# Patient Record
Sex: Female | Born: 1985 | Race: Black or African American | Hispanic: No | Marital: Single | Smoking: Former smoker
Health system: Southern US, Community
[De-identification: ages and names within clinical notes are randomized; demographics above are authoritative.]

## PROBLEM LIST (undated history)

## (undated) ENCOUNTER — Inpatient Hospital Stay (HOSPITAL_COMMUNITY): Payer: Self-pay

## (undated) DIAGNOSIS — E282 Polycystic ovarian syndrome: Secondary | ICD-10-CM

## (undated) DIAGNOSIS — A749 Chlamydial infection, unspecified: Secondary | ICD-10-CM

## (undated) HISTORY — PX: DILATION AND CURETTAGE OF UTERUS: SHX78

---
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
## 2005-11-12 ENCOUNTER — Emergency Department (HOSPITAL_COMMUNITY): Admission: EM | Admit: 2005-11-12 | Discharge: 2005-11-12 | Payer: Self-pay | Admitting: Emergency Medicine

## 2006-10-24 ENCOUNTER — Emergency Department (HOSPITAL_COMMUNITY): Admission: EM | Admit: 2006-10-24 | Discharge: 2006-10-24 | Payer: Self-pay | Admitting: Emergency Medicine

## 2006-12-24 ENCOUNTER — Emergency Department (HOSPITAL_COMMUNITY): Admission: EM | Admit: 2006-12-24 | Discharge: 2006-12-24 | Payer: Self-pay | Admitting: Emergency Medicine

## 2007-01-31 ENCOUNTER — Emergency Department (HOSPITAL_COMMUNITY): Admission: EM | Admit: 2007-01-31 | Discharge: 2007-01-31 | Payer: Self-pay | Admitting: Family Medicine

## 2008-09-16 ENCOUNTER — Emergency Department (HOSPITAL_COMMUNITY): Admission: EM | Admit: 2008-09-16 | Discharge: 2008-09-16 | Payer: Self-pay | Admitting: Emergency Medicine

## 2008-12-01 ENCOUNTER — Inpatient Hospital Stay (HOSPITAL_COMMUNITY): Admission: AD | Admit: 2008-12-01 | Discharge: 2008-12-01 | Payer: Self-pay | Admitting: Obstetrics & Gynecology

## 2009-06-13 ENCOUNTER — Ambulatory Visit: Payer: Self-pay | Admitting: Obstetrics and Gynecology

## 2009-06-13 ENCOUNTER — Inpatient Hospital Stay (HOSPITAL_COMMUNITY): Admission: AD | Admit: 2009-06-13 | Discharge: 2009-06-13 | Payer: Self-pay | Admitting: Obstetrics & Gynecology

## 2009-11-23 ENCOUNTER — Ambulatory Visit: Payer: Self-pay | Admitting: Obstetrics and Gynecology

## 2009-11-23 ENCOUNTER — Inpatient Hospital Stay (HOSPITAL_COMMUNITY): Admission: AD | Admit: 2009-11-23 | Discharge: 2009-11-23 | Payer: Self-pay | Admitting: Obstetrics & Gynecology

## 2010-04-19 LAB — CBC
HCT: 33.8 % — ABNORMAL LOW (ref 36.0–46.0)
Hemoglobin: 11.3 g/dL — ABNORMAL LOW (ref 12.0–15.0)
MCH: 32.9 pg (ref 26.0–34.0)
MCHC: 33.5 g/dL (ref 30.0–36.0)
MCV: 98.2 fL (ref 78.0–100.0)
Platelets: 220 10*3/uL (ref 150–400)
RBC: 3.44 MIL/uL — ABNORMAL LOW (ref 3.87–5.11)
RDW: 13.1 % (ref 11.5–15.5)
WBC: 7.3 10*3/uL (ref 4.0–10.5)

## 2010-04-19 LAB — URINALYSIS, ROUTINE W REFLEX MICROSCOPIC
Bilirubin Urine: NEGATIVE
Glucose, UA: NEGATIVE mg/dL
Hgb urine dipstick: NEGATIVE
Ketones, ur: NEGATIVE mg/dL
Nitrite: NEGATIVE
Protein, ur: NEGATIVE mg/dL
Specific Gravity, Urine: 1.015 (ref 1.005–1.030)
Urobilinogen, UA: 1 mg/dL (ref 0.0–1.0)
pH: 8.5 — ABNORMAL HIGH (ref 5.0–8.0)

## 2010-04-19 LAB — POCT PREGNANCY, URINE: Preg Test, Ur: NEGATIVE

## 2010-04-19 LAB — WET PREP, GENITAL
Clue Cells Wet Prep HPF POC: NONE SEEN
Trich, Wet Prep: NONE SEEN
Yeast Wet Prep HPF POC: NONE SEEN

## 2010-04-19 LAB — GC/CHLAMYDIA PROBE AMP, GENITAL: GC Probe Amp, Genital: NEGATIVE

## 2010-04-25 LAB — WET PREP, GENITAL

## 2010-04-25 LAB — CBC
Hemoglobin: 12.3 g/dL (ref 12.0–15.0)
MCHC: 34.9 g/dL (ref 30.0–36.0)
RBC: 3.59 MIL/uL — ABNORMAL LOW (ref 3.87–5.11)
WBC: 6 10*3/uL (ref 4.0–10.5)

## 2010-04-25 LAB — GC/CHLAMYDIA PROBE AMP, GENITAL
Chlamydia, DNA Probe: NEGATIVE
GC Probe Amp, Genital: NEGATIVE

## 2010-05-11 LAB — WET PREP, GENITAL
Trich, Wet Prep: NONE SEEN
Yeast Wet Prep HPF POC: NONE SEEN

## 2010-05-11 LAB — GC/CHLAMYDIA PROBE AMP, GENITAL
Chlamydia, DNA Probe: NEGATIVE
GC Probe Amp, Genital: NEGATIVE

## 2010-05-13 LAB — POCT URINALYSIS DIP (DEVICE)
Nitrite: NEGATIVE
Protein, ur: NEGATIVE mg/dL
Urobilinogen, UA: 1 mg/dL (ref 0.0–1.0)
pH: 7 (ref 5.0–8.0)

## 2010-05-13 LAB — WET PREP, GENITAL: Yeast Wet Prep HPF POC: NONE SEEN

## 2010-05-13 LAB — POCT PREGNANCY, URINE: Preg Test, Ur: NEGATIVE

## 2010-06-23 NOTE — Op Note (Signed)
[facility]  Patient:    Mercedes Riley, Mercedes Riley                        MRN: [id number] Proc. Date: 08/21/00 Adm. Date:  [id number] Disc. Date: [id number] Attending:  Ilene Qua                           Operative Report  PREOPERATIVE DIAGNOSIS:       Incomplete abortion.  POSTOPERATIVE DIAGNOSIS:      Incomplete abortion.  PROCEDURE:                    Dilation and evacuation.  SURGEON:                      Sherry A. Rosalio Macadamia, M.D.  ANESTHESIA:                   MAC.  INDICATIONS:                  The patient is a 25 year old G1, P0 female whose last menstrual period was Jun 11, 2000.  The patient had a positive pregnancy test on July 12.  The patient noted the onset of bleeding the evening of July 12.  She continued to have bleeding for the next five days.  Her cramping became intermittent.  The patient had severe pain and cramping on July 17 in the early morning.  The patient was seen at [facility] Emergency Room and was found to have an inevitable abortion.  She had not passed any tissue at that time.  Her bleeding was minimal.  She was sent home.  The patient noted increased cramping and bleeding.  The patient was brought to [facility] by EMS.  The patient was evaluated and felt to have only mild bleeding but, because of the bleeding and cramping with ultrasound showing no fetal heart tones, the patient is prepared for D&E.  On exam in the emergency room, the patient had passed the fetal sac but no placental tissue.  FINDINGS:                     An eight-week sized anteflexed uterus.  A moderate amount of placental tissue within the uterus.  No adnexal mass.  DESCRIPTION OF PROCEDURE:     The patient was brought into the operating room and given adequate IV sedation.  She was placed in the dorsal lithotomy position.  Her perineum was washed with Betadine.  Pelvic exam was performed. The patient was draped in a sterile fashion.  A speculum  was placed within the vagina.  The vagina was washed with Betadine.  A paracervical block was administered with 1% Nesacaine.  The cervix was grasped with a single-tooth tenaculum.  Blood lot was removed from the cervix.  A #8 curved curet was introduced into the endometrial cavity.  No dilating was necessary.  Suction was applied.  POC were obtained.  Suction was continued until no further tissue was present and adequate hemostasis was present.  All instruments were then removed from the vagina.  The patient was taken out of the dorsal lithotomy position.  She was awakened.  She was moved from the operating table to a stretcher in stable condition.  COMPLICATIONS:                None.  ESTIMATED BLOOD LOSS:  Less than 5 cc. DD:  08/21/00 TD:  08/21/00 Job: [id number]

## 2010-08-09 ENCOUNTER — Emergency Department (HOSPITAL_COMMUNITY)
Admission: EM | Admit: 2010-08-09 | Discharge: 2010-08-10 | Disposition: A | Discharge status: Home / Self Care | Payer: Self-pay | Attending: Emergency Medicine | Admitting: Emergency Medicine

## 2010-08-09 DIAGNOSIS — Y92009 Unspecified place in unspecified non-institutional (private) residence as the place of occurrence of the external cause: Secondary | ICD-10-CM | POA: Insufficient documentation

## 2010-08-09 DIAGNOSIS — S71109A Unspecified open wound, unspecified thigh, initial encounter: Secondary | ICD-10-CM | POA: Insufficient documentation

## 2010-08-09 DIAGNOSIS — S71009A Unspecified open wound, unspecified hip, initial encounter: Secondary | ICD-10-CM | POA: Insufficient documentation

## 2010-08-09 DIAGNOSIS — W540XXA Bitten by dog, initial encounter: Secondary | ICD-10-CM | POA: Insufficient documentation

## 2010-11-10 LAB — GC/CHLAMYDIA PROBE AMP, GENITAL: Chlamydia, DNA Probe: NEGATIVE

## 2010-11-10 LAB — WET PREP, GENITAL
Trich, Wet Prep: NONE SEEN
Yeast Wet Prep HPF POC: NONE SEEN

## 2010-11-10 LAB — POCT PREGNANCY, URINE
Operator id: 239701
Preg Test, Ur: NEGATIVE

## 2010-11-14 LAB — POCT PREGNANCY, URINE: Preg Test, Ur: NEGATIVE

## 2010-11-14 LAB — POCT URINALYSIS DIP (DEVICE)
Bilirubin Urine: NEGATIVE
Ketones, ur: NEGATIVE
Specific Gravity, Urine: 1.025
pH: 6.5

## 2010-11-14 LAB — WET PREP, GENITAL
Trich, Wet Prep: NONE SEEN
Yeast Wet Prep HPF POC: NONE SEEN

## 2010-11-14 LAB — URINE CULTURE
Colony Count: NO GROWTH
Culture: NO GROWTH

## 2010-11-14 LAB — RPR: RPR Ser Ql: NONREACTIVE

## 2010-11-14 LAB — HIV ANTIBODY (ROUTINE TESTING W REFLEX): HIV: NONREACTIVE

## 2010-11-14 LAB — HEPATITIS B CORE ANTIBODY, TOTAL: Hep B Core Total Ab: NEGATIVE

## 2010-11-14 LAB — HEPATITIS C ANTIBODY: HCV Ab: NEGATIVE

## 2010-12-16 ENCOUNTER — Encounter: Payer: Self-pay | Admitting: *Deleted

## 2010-12-16 ENCOUNTER — Emergency Department (HOSPITAL_COMMUNITY): Payer: Self-pay

## 2010-12-16 ENCOUNTER — Emergency Department (HOSPITAL_COMMUNITY)
Admission: EM | Admit: 2010-12-16 | Discharge: 2010-12-17 | Disposition: A | Discharge status: Home / Self Care | Payer: Self-pay | Attending: Emergency Medicine | Admitting: Emergency Medicine

## 2010-12-16 DIAGNOSIS — S62309A Unspecified fracture of unspecified metacarpal bone, initial encounter for closed fracture: Secondary | ICD-10-CM | POA: Insufficient documentation

## 2010-12-16 DIAGNOSIS — M7989 Other specified soft tissue disorders: Secondary | ICD-10-CM | POA: Insufficient documentation

## 2010-12-16 DIAGNOSIS — F17200 Nicotine dependence, unspecified, uncomplicated: Secondary | ICD-10-CM | POA: Insufficient documentation

## 2010-12-16 DIAGNOSIS — W2209XA Striking against other stationary object, initial encounter: Secondary | ICD-10-CM | POA: Insufficient documentation

## 2010-12-16 NOTE — ED Notes (Signed)
Pt in c/o right hand pain after punching a wall

## 2010-12-17 MED ORDER — HYDROCODONE-ACETAMINOPHEN 5-325 MG PO TABS: 2.0000 | ORAL_TABLET | ORAL | Status: AC | PRN

## 2010-12-17 MED ORDER — OXYCODONE-ACETAMINOPHEN 5-325 MG PO TABS
ORAL_TABLET | ORAL | Status: AC
  Administered 2010-12-17: 1
  Filled 2010-12-17: qty 1

## 2010-12-17 NOTE — ED Provider Notes (Signed)
History     CSN: [id number] Arrival date & time: 12/16/2010 11:44 PM   First MD Initiated Contact with Patient 12/17/10 [phone]      Chief Complaint  Patient presents with  . Hand Injury    (Consider location/radiation/quality/duration/timing/severity/associated sxs/prior treatment) Patient is a 25 y.o. female presenting with hand injury. The history is provided by the patient.  Hand Injury  The incident occurred 3 to 5 hours ago. The incident occurred at home. Injury mechanism: She struck a wall in anger injuring her right hand. The pain is present in the right wrist and right hand. The quality of the pain is described as throbbing. The pain is moderate. The pain has been constant since the incident. Pertinent negatives include no malaise/fatigue. She reports no foreign bodies present. The symptoms are aggravated by movement, palpation and use. She has tried nothing for the symptoms.   No other injuries, no tingling in the fingers, constant pain, which is moderate.  History reviewed. No pertinent past medical history.  History reviewed. No pertinent past surgical history.  History reviewed. No pertinent family history.  History  Substance Use Topics  . Smoking status: Current Everyday Smoker  . Smokeless tobacco: Not on file  . Alcohol Use: Yes    OB History    Grav Para Term Preterm Abortions TAB SAB Ect Mult Living                  Review of Systems  Constitutional: Negative for malaise/fatigue.  All other systems reviewed and are negative.    Allergies  Review of patient's allergies indicates no known allergies.  Home Medications   Current Outpatient Rx  Name Route Sig Dispense Refill  . IBUPROFEN 200 MG PO TABS Oral Take 200 mg by mouth every 6 (six) hours as needed. For pain       BP 110/61  Pulse 84  Temp(Src) 98 F (36.7 C) (Oral)  Resp 20  SpO2 99%  Physical Exam  Nursing note and vitals reviewed. Constitutional: She is oriented to person, place,  and time. She appears well-developed and well-nourished.  HENT:  Head: Normocephalic.  Neck: Normal range of motion.  Pulmonary/Chest: Effort normal.  Musculoskeletal:       Right hand, tender, swollen and ecchymotic at the base of the fifth metacarpal, without deformity. Mild, right wrist. Tenderness dorsally, but no swelling or limitation of motion. Fingers of the right hand appear normal.  Neurological: She is alert and oriented to person, place, and time.  Skin: Skin is warm and dry.  Psychiatric: She has a normal mood and affect. Her behavior is normal. Judgment and thought content normal.    ED Course  Procedures (including critical care time) Orthopedic splint  placed by the orthopedic technician Labs Reviewed - No data to display Dg Hand Complete Right  12/17/2010  *RADIOLOGY REPORT*  Clinical Data: Hit wall; right fifth metacarpal pain.  RIGHT HAND - COMPLETE 3+ VIEW  Comparison: None.  Findings: There is a mildly displaced oblique fracture through the base of the fifth metacarpal, extending to the edge of the carpometacarpal articulation.  No additional fractures are seen.  Overlying soft tissue swelling is noted.  The carpal rows are intact, and demonstrate normal alignment.  IMPRESSION: Mildly displaced oblique fracture through the base of the fifth metacarpal, extending to the edge of the carpometacarpal articulation.  Original Report Authenticated By: Tonia Ghent, M.D.    Diagnosis: Metacarpal fracture  MDM  Nondisplaced fracture, treated with splint, referred  to orthopedics for disposition        Flint Melter, MD 12/17/10 [phone]

## 2011-08-25 ENCOUNTER — Emergency Department (HOSPITAL_COMMUNITY)
Admission: EM | Admit: 2011-08-25 | Discharge: 2011-08-25 | Disposition: A | Discharge status: Home / Self Care | Payer: Self-pay | Attending: Emergency Medicine | Admitting: Emergency Medicine

## 2011-08-25 ENCOUNTER — Emergency Department (HOSPITAL_COMMUNITY): Payer: Self-pay

## 2011-08-25 ENCOUNTER — Encounter (HOSPITAL_COMMUNITY): Payer: Self-pay | Admitting: Physical Medicine and Rehabilitation

## 2011-08-25 DIAGNOSIS — F17200 Nicotine dependence, unspecified, uncomplicated: Secondary | ICD-10-CM | POA: Insufficient documentation

## 2011-08-25 DIAGNOSIS — M722 Plantar fascial fibromatosis: Secondary | ICD-10-CM | POA: Insufficient documentation

## 2011-08-25 MED ORDER — NAPROXEN 500 MG PO TABS: 500.0000 mg | ORAL_TABLET | Freq: Two times a day (BID) | ORAL | Status: DC

## 2011-08-25 NOTE — ED Provider Notes (Addendum)
History     CSN: [id number]  Arrival date & time 08/25/11  1215   First MD Initiated Contact with Patient 08/25/11 1226      Chief Complaint  Patient presents with  . Foot Pain    (Consider location/radiation/quality/duration/timing/severity/associated sxs/prior treatment) HPI Patient presents with left foot pain for 2 days. 9/10 pain. Patient states the foot had been swollen and painful to touch on bottom of foot, and worse with weight bearing. She denies recent injury or trauma to foot. Described as sharp and aching.     No past medical history on file.  No past surgical history on file.  No family history on file.  History  Substance Use Topics  . Smoking status: Current Everyday Smoker  . Smokeless tobacco: Not on file  . Alcohol Use: Yes    OB History    Grav Para Term Preterm Abortions TAB SAB Ect Mult Living                  Review of Systems  Constitutional: Negative for fever.  HENT: Negative for neck pain.   Respiratory: Negative for shortness of breath.   Cardiovascular: Negative for chest pain.  Gastrointestinal: Negative for abdominal pain.  Genitourinary: Negative for dysuria.  Musculoskeletal: Negative for back pain.  Skin: Negative for rash.  Neurological: Negative for headaches.  Hematological: Does not bruise/bleed easily.    Allergies  Review of patient's allergies indicates no known allergies.  Home Medications   Current Outpatient Rx  Name Route Sig Dispense Refill  . NAPROXEN 500 MG PO TABS Oral Take 1 tablet (500 mg total) by mouth 2 (two) times daily. 14 tablet 0    BP 111/65  Pulse 86  Temp 98.6 F (37 C) (Oral)  Resp 18  SpO2 99%  LMP 08/23/2011  Physical Exam  Nursing note and vitals reviewed. Constitutional: She is oriented to person, place, and time. She appears well-developed and well-nourished.  HENT:  Head: Normocephalic and atraumatic.  Mouth/Throat: Oropharynx is clear and moist.  Eyes: Conjunctivae and EOM  are normal. Pupils are equal, round, and reactive to light.  Neck: Normal range of motion. Neck supple.  Cardiovascular: Normal rate, regular rhythm and normal heart sounds.   No murmur heard. Pulmonary/Chest: Effort normal and breath sounds normal.  Abdominal: Soft. Bowel sounds are normal. There is no tenderness.  Musculoskeletal: Normal range of motion. She exhibits no edema.       Left foot with mild tenderness to bottom of foot. No swelling no erythema.  Neurological: She is alert and oriented to person, place, and time. No cranial nerve deficit. She exhibits normal muscle tone. Coordination normal.  Skin: Skin is warm. No erythema.    ED Course  Procedures (including critical care time)  Labs Reviewed - No data to display Dg Foot Complete Left  08/25/2011  *RADIOLOGY REPORT*  Clinical Data: Left-sided foot pain.  LEFT FOOT - COMPLETE 3+ VIEW  Comparison: No priors.  Findings: Three views left foot demonstrate no acute fracture, subluxation, dislocation, joint or soft tissue abnormality.  IMPRESSION: 1.  No acute radiographic abnormality of the left foot to account for the patient's symptoms.  Original Report Authenticated By: Florencia Reasons, M.D.     1. Plantar fasciitis       MDM   Symptoms consistent with a plantar fasciitis we'll treat with anti-inflammatories and podiatry referral recommended.            Shelda Jakes, MD 08/25/11 1352  Shelda Jakes, MD 10/10/11 [phone]

## 2011-08-25 NOTE — ED Notes (Signed)
Pt presents to department for evaluation of L foot pain. Ongoing x2 days. Pt states foot has been swollen and painful to touch at home. 9/10 pain, becomes worse with weight bearing. Denies recent injuries. No obvious deformities noted. She is alert and oriented x4. No signs of acute distress noted.

## 2012-02-04 ENCOUNTER — Encounter (HOSPITAL_COMMUNITY): Payer: Self-pay | Admitting: Cardiology

## 2012-02-04 ENCOUNTER — Emergency Department (HOSPITAL_COMMUNITY)
Admission: EM | Admit: 2012-02-04 | Discharge: 2012-02-04 | Disposition: A | Discharge status: Home / Self Care | Payer: Self-pay | Attending: Emergency Medicine | Admitting: Emergency Medicine

## 2012-02-04 DIAGNOSIS — N760 Acute vaginitis: Secondary | ICD-10-CM | POA: Insufficient documentation

## 2012-02-04 DIAGNOSIS — F17200 Nicotine dependence, unspecified, uncomplicated: Secondary | ICD-10-CM | POA: Insufficient documentation

## 2012-02-04 DIAGNOSIS — R109 Unspecified abdominal pain: Secondary | ICD-10-CM

## 2012-02-04 DIAGNOSIS — B9689 Other specified bacterial agents as the cause of diseases classified elsewhere: Secondary | ICD-10-CM

## 2012-02-04 DIAGNOSIS — Z79899 Other long term (current) drug therapy: Secondary | ICD-10-CM | POA: Insufficient documentation

## 2012-02-04 DIAGNOSIS — R1031 Right lower quadrant pain: Secondary | ICD-10-CM | POA: Insufficient documentation

## 2012-02-04 LAB — WET PREP, GENITAL
Trich, Wet Prep: NONE SEEN
Yeast Wet Prep HPF POC: NONE SEEN

## 2012-02-04 LAB — BASIC METABOLIC PANEL WITH GFR
BUN: 13 mg/dL (ref 6–23)
CO2: 25 meq/L (ref 19–32)
Calcium: 8.9 mg/dL (ref 8.4–10.5)
Chloride: 102 meq/L (ref 96–112)
Creatinine, Ser: 0.66 mg/dL (ref 0.50–1.10)
GFR calc Af Amer: >90 mL/min
GFR calc non Af Amer: >90 mL/min
Glucose, Bld: 101 mg/dL — ABNORMAL HIGH (ref 70–99)
Potassium: 3.5 meq/L (ref 3.5–5.1)
Sodium: 138 meq/L (ref 135–145)

## 2012-02-04 LAB — CBC WITH DIFFERENTIAL/PLATELET
Lymphocytes Relative: 26 % (ref 12–46)
Lymphs Abs: 2 10*3/uL (ref 0.7–4.0)
Neutrophils Relative %: 63 % (ref 43–77)
Platelets: 232 10*3/uL (ref 150–400)
RBC: 3.73 MIL/uL — ABNORMAL LOW (ref 3.87–5.11)
WBC: 8 10*3/uL (ref 4.0–10.5)

## 2012-02-04 LAB — URINALYSIS, ROUTINE W REFLEX MICROSCOPIC
Glucose, UA: NEGATIVE mg/dL
Hgb urine dipstick: NEGATIVE
Leukocytes, UA: NEGATIVE
Specific Gravity, Urine: 1.026 (ref 1.005–1.030)
Urobilinogen, UA: 0.2 mg/dL (ref 0.0–1.0)

## 2012-02-04 LAB — PREGNANCY, URINE: Preg Test, Ur: NEGATIVE

## 2012-02-04 MED ORDER — METRONIDAZOLE 500 MG PO TABS
500.0000 mg | ORAL_TABLET | Freq: Once | ORAL | Status: AC
  Administered 2012-02-04: 500 mg via ORAL
  Filled 2012-02-04: qty 1

## 2012-02-04 MED ORDER — ONDANSETRON HCL 4 MG/2ML IJ SOLN
4.0000 mg | Freq: Once | INTRAMUSCULAR | Status: AC
  Administered 2012-02-04: 4 mg via INTRAVENOUS
  Filled 2012-02-04: qty 2

## 2012-02-04 MED ORDER — NAPROXEN 375 MG PO TABS: 375.0000 mg | ORAL_TABLET | Freq: Two times a day (BID) | ORAL | Status: DC

## 2012-02-04 MED ORDER — METRONIDAZOLE 500 MG PO TABS: 500.0000 mg | ORAL_TABLET | Freq: Two times a day (BID) | ORAL | Status: DC

## 2012-02-04 MED ORDER — MORPHINE SULFATE 4 MG/ML IJ SOLN
4.0000 mg | Freq: Once | INTRAMUSCULAR | Status: AC
  Administered 2012-02-04: 4 mg via INTRAVENOUS
  Filled 2012-02-04: qty 1

## 2012-02-04 MED ORDER — SODIUM CHLORIDE 0.9 % IV BOLUS (SEPSIS)
1000.0000 mL | Freq: Once | INTRAVENOUS | Status: AC
  Administered 2012-02-04: 1000 mL via INTRAVENOUS

## 2012-02-04 NOTE — ED Provider Notes (Signed)
Medical screening examination/treatment/procedure(s) were performed by non-physician practitioner and as supervising physician I was immediately available for consultation/collaboration.   Glynn Octave, MD 02/04/12 [phone]

## 2012-02-04 NOTE — ED Notes (Signed)
Pt reports right-sided abd pain that started last night. Reports pain with movement. Denies any n/v.

## 2012-02-04 NOTE — ED Provider Notes (Signed)
History     CSN: [id number]  Arrival date & time 02/04/12  4098   First MD Initiated Contact with Patient 02/04/12 [phone]      No chief complaint on file.   (Consider location/radiation/quality/duration/timing/severity/associated sxs/prior treatment) HPI  26 year old female presents c/o R side pain.  Patient reports yesterday while she was sitting on the couch she developed acute onset of pain to her right side of abdomen. Describe pain as a sharp sensation that is constant, with mild nausea.  Pain is been persistent, moderate in severity, nothing makes it better, however worse with movement. She tried taking extra strength Tylenol without relief. Pain woke her up from sleep this morning which prompted her to come to the ER for further evaluation. She has never had similar pain like this before. She denies fever, chills, chest pain, shortness of breath, back pain, vomiting, diarrhea, rash, dysuria, hematuria, or vaginal discharge. Last menstrual period was 3 weeks ago. She has normal appetite. She denies any recent strenuous exercise or heavy lifting. No prior history of kidney stone. No history of appendectomy.  No past medical history on file.  No past surgical history on file.  No family history on file.  History  Substance Use Topics  . Smoking status: Current Every Day Smoker  . Smokeless tobacco: Not on file  . Alcohol Use: Yes    OB History    Grav Para Term Preterm Abortions TAB SAB Ect Mult Living                  Review of Systems  Constitutional: Negative for fever.       10 Systems reviewed and are negative for acute change except as noted in the HPI  HENT: Negative for rhinorrhea.   Respiratory: Negative for cough and shortness of breath.   Cardiovascular: Negative for chest pain.  Gastrointestinal: Positive for abdominal pain. Negative for vomiting and diarrhea.  Genitourinary: Negative for dysuria.  Musculoskeletal: Negative for back pain.  Skin: Negative  for rash.  Neurological: Negative for dizziness, syncope, speech difficulty, weakness, numbness and headaches.  Psychiatric/Behavioral: Negative for confusion.    Allergies  Review of patient's allergies indicates no known allergies.  Home Medications   Current Outpatient Rx  Name  Route  Sig  Dispense  Refill  . NAPROXEN 500 MG PO TABS   Oral   Take 1 tablet (500 mg total) by mouth 2 (two) times daily.   14 tablet   0     There were no vitals taken for this visit.  Physical Exam  Nursing note and vitals reviewed. Constitutional: She is oriented to person, place, and time. She appears well-developed and well-nourished. No distress.       Awake, alert, nontoxic appearance  HENT:  Head: Atraumatic.  Eyes: Conjunctivae normal are normal. Right eye exhibits no discharge. Left eye exhibits no discharge.  Neck: Neck supple.  Cardiovascular: Normal rate and regular rhythm.   Pulmonary/Chest: Effort normal. No respiratory distress. She exhibits no tenderness.  Abdominal: Soft. There is tenderness (tenderness to RLQ and to lateral aspect of abdomen.  No CVA tenderness, no hernia noted.  No overlying skin changes). There is no rebound and no guarding. Hernia confirmed negative in the right inguinal area and confirmed negative in the left inguinal area.  Genitourinary: Uterus normal. There is no tenderness or lesion on the right labia. There is no tenderness or lesion on the left labia. Cervix exhibits no motion tenderness, no discharge and  no friability. Right adnexum displays no tenderness. Left adnexum displays no tenderness. No tenderness or bleeding around the vagina. No foreign body around the vagina. Vaginal discharge found.       Chaperone present  Moderate functional discharge.  No tenderness.  No rash, no abnormal bleeding.   Musculoskeletal: She exhibits no tenderness.       ROM appears intact, no obvious focal weakness  Lymphadenopathy:       Right: No inguinal adenopathy  present.       Left: No inguinal adenopathy present.  Neurological: She is alert and oriented to person, place, and time.       Mental status and motor strength appears intact  Skin: No rash noted.  Psychiatric: She has a normal mood and affect.    ED Course  Procedures (including critical care time)  Results for orders placed during the hospital encounter of 02/04/12  CBC WITH DIFFERENTIAL      Component Value Range   WBC 8.0  4.0 - 10.5 K/uL   RBC 3.73 (*) 3.87 - 5.11 MIL/uL   Hemoglobin 11.6 (*) 12.0 - 15.0 g/dL   HCT 16.1 (*) 09.6 - 04.5 %   MCV 95.7  78.0 - 100.0 fL   MCH 31.1  26.0 - 34.0 pg   MCHC 32.5  30.0 - 36.0 g/dL   RDW 40.9  81.1 - 91.4 %   Platelets 232  150 - 400 K/uL   Neutrophils Relative 63  43 - 77 %   Neutro Abs 5.0  1.7 - 7.7 K/uL   Lymphocytes Relative 26  12 - 46 %   Lymphs Abs 2.0  0.7 - 4.0 K/uL   Monocytes Relative 7  3 - 12 %   Monocytes Absolute 0.5  0.1 - 1.0 K/uL   Eosinophils Relative 4  0 - 5 %   Eosinophils Absolute 0.3  0.0 - 0.7 K/uL   Basophils Relative 1  0 - 1 %   Basophils Absolute 0.0  0.0 - 0.1 K/uL  BASIC METABOLIC PANEL      Component Value Range   Sodium 138  135 - 145 mEq/L   Potassium 3.5  3.5 - 5.1 mEq/L   Chloride 102  96 - 112 mEq/L   CO2 25  19 - 32 mEq/L   Glucose, Bld 101 (*) 70 - 99 mg/dL   BUN 13  6 - 23 mg/dL   Creatinine, Ser 7.82  0.50 - 1.10 mg/dL   Calcium 8.9  8.4 - 95.6 mg/dL   GFR calc non Af Amer >90  >90 mL/min   GFR calc Af Amer >90  >90 mL/min  URINALYSIS, ROUTINE W REFLEX MICROSCOPIC      Component Value Range   Color, Urine YELLOW  YELLOW   APPearance HAZY (*) CLEAR   Specific Gravity, Urine 1.026  1.005 - 1.030   pH 8.0  5.0 - 8.0   Glucose, UA NEGATIVE  NEGATIVE mg/dL   Hgb urine dipstick NEGATIVE  NEGATIVE   Bilirubin Urine NEGATIVE  NEGATIVE   Ketones, ur NEGATIVE  NEGATIVE mg/dL   Protein, ur NEGATIVE  NEGATIVE mg/dL   Urobilinogen, UA 0.2  0.0 - 1.0 mg/dL   Nitrite NEGATIVE  NEGATIVE    Leukocytes, UA NEGATIVE  NEGATIVE  PREGNANCY, URINE      Component Value Range   Preg Test, Ur NEGATIVE  NEGATIVE  WET PREP, GENITAL      Component Value Range   Yeast Wet Prep HPF  POC NONE SEEN  NONE SEEN   Trich, Wet Prep NONE SEEN  NONE SEEN   Clue Cells Wet Prep HPF POC MANY (*) NONE SEEN   WBC, Wet Prep HPF POC FEW (*) NONE SEEN   No results found.  1. Abdominal pain 2. BV    MDM  Pt with R flank pain and RLQ abd pain.  Abdomen non surgical. Pain worsening with movement.  Able to ambulate.    10:37 AM Pelvic exam unremarkable.  Wet prep shows finding suggestive of BV.  Pt has hx of recurrent BV.  Flagyl prescribed.  Pt report pain is much improved, to a 2/10.  Pt has appetite and requesting food.  Her labs are otherwise unremarkable.  I have low suspicion for appendicitis, ovarian torsion, tuboovarian abscess, or other life threatening emergency.     11:34 AM Pt felt much better. Able to tolerates PO, able to ambulate without difficulty. Abdomen with minimal tenderness to R flank on reexamination.  Will treat for BV with flagyl and will give short course of pain meds.  Pt to return if worsen.  Pt voice understanding and agrees with plan.    BP 107/67  Pulse 81  Temp 97.5 F (36.4 C) (Oral)  Resp 18  SpO2 100%  LMP 01/14/2012  I have reviewed nursing notes and vital signs. I personally reviewed the imaging tests through PACS system  I reviewed available ER/hospitalization records thought the EMR   Fayrene Helper, [place] 02/04/12 1135

## 2012-02-04 NOTE — ED Notes (Signed)
Pt instructed to change into a gown. Urine cup at the bedside.  The patient stated she just went to the restroom and is unable to go at this time

## 2012-02-06 LAB — GC/CHLAMYDIA PROBE AMP: CT Probe RNA: POSITIVE — AB

## 2012-06-14 ENCOUNTER — Inpatient Hospital Stay (HOSPITAL_COMMUNITY)
Admission: AD | Admit: 2012-06-14 | Discharge: 2012-06-14 | Discharge status: Against Medical Advice | Payer: Self-pay | Source: Ambulatory Visit | Attending: Obstetrics & Gynecology | Admitting: Obstetrics & Gynecology

## 2012-06-14 DIAGNOSIS — N938 Other specified abnormal uterine and vaginal bleeding: Secondary | ICD-10-CM | POA: Insufficient documentation

## 2012-06-14 DIAGNOSIS — R109 Unspecified abdominal pain: Secondary | ICD-10-CM | POA: Insufficient documentation

## 2012-06-14 DIAGNOSIS — N949 Unspecified condition associated with female genital organs and menstrual cycle: Secondary | ICD-10-CM | POA: Insufficient documentation

## 2012-06-14 NOTE — MAU Note (Signed)
Pt reports she has had vaginal bleeding for the past 3 month. Denies using any hormonal birth control. Has occasional sharp abd pain as well.

## 2012-06-17 ENCOUNTER — Encounter (HOSPITAL_COMMUNITY): Payer: Self-pay | Admitting: *Deleted

## 2012-06-17 ENCOUNTER — Inpatient Hospital Stay (HOSPITAL_COMMUNITY)
Admission: AD | Admit: 2012-06-17 | Discharge: 2012-06-17 | Disposition: A | Discharge status: Home / Self Care | Payer: Self-pay | Source: Ambulatory Visit | Attending: Obstetrics & Gynecology | Admitting: Obstetrics & Gynecology

## 2012-06-17 ENCOUNTER — Inpatient Hospital Stay (HOSPITAL_COMMUNITY): Payer: Self-pay

## 2012-06-17 DIAGNOSIS — N926 Irregular menstruation, unspecified: Secondary | ICD-10-CM | POA: Insufficient documentation

## 2012-06-17 DIAGNOSIS — N949 Unspecified condition associated with female genital organs and menstrual cycle: Secondary | ICD-10-CM | POA: Insufficient documentation

## 2012-06-17 DIAGNOSIS — N970 Female infertility associated with anovulation: Secondary | ICD-10-CM

## 2012-06-17 DIAGNOSIS — N938 Other specified abnormal uterine and vaginal bleeding: Secondary | ICD-10-CM | POA: Insufficient documentation

## 2012-06-17 HISTORY — DX: Chlamydial infection, unspecified: A74.9

## 2012-06-17 LAB — CBC
HCT: 36.2 % (ref 36.0–46.0)
Hemoglobin: 12.3 g/dL (ref 12.0–15.0)
MCV: 95 fL (ref 78.0–100.0)
RBC: 3.81 MIL/uL — ABNORMAL LOW (ref 3.87–5.11)
RDW: 12.9 % (ref 11.5–15.5)
WBC: 8.4 10*3/uL (ref 4.0–10.5)

## 2012-06-17 LAB — WET PREP, GENITAL

## 2012-06-17 MED ORDER — NORGESTIMATE-ETH ESTRADIOL 0.25-35 MG-MCG PO TABS: ORAL_TABLET | ORAL | Status: DC

## 2012-06-17 NOTE — MAU Provider Note (Signed)
History     CSN: [id number]  Arrival date and time: 06/17/12 1213   First Provider Initiated Contact with Patient 06/17/12 1257      Chief Complaint  Patient presents with  . Vaginal Bleeding   HPI  Pt is not pregnant and presents with abnormal/irregular periods with bleeding since Feb 5, 2014with a couple of days spotting and then light and then heavy for a couple weeks.  Pt denies headaches. Pt does not have cramps.  She last had intercourse 02/06/2012.  She has had to miss school due to heavy bleeding, soaking clothes.   Jari Favre Spurlock-Frizzell, RN Registered Nurse Signed  MAU Note Service date: 06/17/2012 12:27 PM   Been bleeding for 3 months and 3 days. Been having sharp pains off and on (coiincides with bleeding). Pain is lower pelvic up right side up under breast. Had this same kind of pain in ?Jan/Feb was seen at [facility]    Past Medical History  Diagnosis Date  . Chlamydia     Past Surgical History  Procedure Laterality Date  . Dilation and curettage of uterus      Family History  Problem Relation Age of Onset  . Diabetes Mother   . Asthma Brother   . Seizures Paternal Grandmother     History  Substance Use Topics  . Smoking status: Current Every Day Smoker  . Smokeless tobacco: Not on file  . Alcohol Use: No    Allergies: No Known Allergies  Prescriptions prior to admission  Medication Sig Dispense Refill  . acetaminophen (TYLENOL) 500 MG tablet Take 1,000 mg by mouth every 6 (six) hours as needed. For pain      . metroNIDAZOLE (FLAGYL) 500 MG tablet Take 1 tablet (500 mg total) by mouth 2 (two) times daily.  14 tablet  0  . naproxen (NAPROSYN) 375 MG tablet Take 1 tablet (375 mg total) by mouth 2 (two) times daily.  20 tablet  0    Review of Systems  Constitutional: Negative for fever and chills.  Gastrointestinal: Negative for nausea, vomiting, abdominal pain, diarrhea and constipation.  Genitourinary: Negative for dysuria and urgency.   Physical  Exam   Blood pressure 106/69, pulse 82, temperature 98.7 F (37.1 C), temperature source Oral, resp. rate 20, height 5' 8.5" (1.74 m), weight 95.709 kg (211 lb), last menstrual period 03/17/2012.  Physical Exam  Vitals reviewed. Constitutional: She is oriented to person, place, and time. She appears well-developed and well-nourished.  HENT:  Head: Normocephalic.  Neck: Normal range of motion. Neck supple.  Cardiovascular: Normal rate.   Respiratory: Effort normal.  GI: Soft. She exhibits no distension. There is no tenderness. There is no rebound and no guarding.  Genitourinary:  Small amount of bright red blood in vault; cervix closed NT; uterus NSSC NT; adnexa without palpable enlargement or tenderness  Musculoskeletal: Normal range of motion.  Neurological: She is alert and oriented to person, place, and time.  Skin: Skin is warm and dry.  Psychiatric: She has a normal mood and affect.    MAU Course  Procedures Results for orders placed during the hospital encounter of 06/17/12 (from the past 24 hour(s))  CBC     Status: Abnormal   Collection Time    06/17/12  1:10 PM      Result Value Range   WBC 8.4  4.0 - 10.5 K/uL   RBC 3.81 (*) 3.87 - 5.11 MIL/uL   Hemoglobin 12.3  12.0 - 15.0 g/dL   HCT  36.2  36.0 - 46.0 %   MCV 95.0  78.0 - 100.0 fL   MCH 32.3  26.0 - 34.0 pg   MCHC 34.0  30.0 - 36.0 g/dL   RDW 16.1  09.6 - 04.5 %   Platelets 231  150 - 400 K/uL   Results for orders placed during the hospital encounter of 06/17/12 (from the past 24 hour(s))  CBC     Status: Abnormal   Collection Time    06/17/12  1:10 PM      Result Value Range   WBC 8.4  4.0 - 10.5 K/uL   RBC 3.81 (*) 3.87 - 5.11 MIL/uL   Hemoglobin 12.3  12.0 - 15.0 g/dL   HCT 40.9  81.1 - 91.4 %   MCV 95.0  78.0 - 100.0 fL   MCH 32.3  26.0 - 34.0 pg   MCHC 34.0  30.0 - 36.0 g/dL   RDW 78.2  95.6 - 21.3 %   Platelets 231  150 - 400 K/uL  WET PREP, GENITAL     Status: Abnormal   Collection Time     06/17/12  2:10 PM      Result Value Range   Yeast Wet Prep HPF POC NONE SEEN  NONE SEEN   Trich, Wet Prep NONE SEEN  NONE SEEN   Clue Cells Wet Prep HPF POC FEW (*) NONE SEEN   WBC, Wet Prep HPF POC FEW (*) NONE SEEN  US Transvaginal Non-ob  06/17/2012  *RADIOLOGY REPORT*  Clinical Data: Abnormal uterine bleeding for 3 months.  Sharp pelvic pains.  LMP 03/17/2012  TRANSABDOMINAL AND TRANSVAGINAL ULTRASOUND OF PELVIS Technique:  Both transabdominal and transvaginal ultrasound examinations of the pelvis were performed. Transabdominal technique was performed for global imaging of the pelvis including uterus, ovaries, adnexal regions, and pelvic cul-de-sac.  It was necessary to proceed with endovaginal exam following the transabdominal exam to visualize the myometrium, endometrium and adnexa.  Comparison:  None  Findings:  Uterus: Is anteverted and anteflexed and demonstrates a sagittal length of 8.1 cm, depth of 3.8 cm and width of 5.3 cm. A homogeneous myometrium is seen with no focal abnormality identified  Endometrium: The endometrium is homogeneously echogenic with a width of 9 mm.  No areas of focal thickening or heterogeneity are seen.  Right ovary:  Measures 3.4 x 2.6 x 2.9 cm (13.3 cc) with no focal abnormalities.  Several sub centimeter follicles are identified but number less than 12.  No follicles measuring greater than 10 mm are seen  Left ovary: Measures 4.3 x 3.9 x 2.6 cm (22.6 cc) with no focal abnormality.  Several sub centimeter follicles are identified but number less than 12.  No follicles measuring greater than 10 mm are seen  Other findings: No pelvic fluid or separate adnexal masses are seen.  IMPRESSION: Normal uterine myometrium and endometrium.  Ovaries are compatible with polycystic ovaries based on ovarian volume alone.  Both ovaries demonstrate several sub centimeter follicles but these number less than 12 (the diagnostic criteria for polycystic ovaries).   Original Report  Authenticated By: Rhodia Albright, M.D.    US Pelvis Complete  06/17/2012  *RADIOLOGY REPORT*  Clinical Data: Abnormal uterine bleeding for 3 months.  Sharp pelvic pains.  LMP 03/17/2012  TRANSABDOMINAL AND TRANSVAGINAL ULTRASOUND OF PELVIS Technique:  Both transabdominal and transvaginal ultrasound examinations of the pelvis were performed. Transabdominal technique was performed for global imaging of the pelvis including uterus, ovaries, adnexal regions, and pelvic cul-de-sac.  It was necessary to proceed with endovaginal exam following the transabdominal exam to visualize the myometrium, endometrium and adnexa.  Comparison:  None  Findings:  Uterus: Is anteverted and anteflexed and demonstrates a sagittal length of 8.1 cm, depth of 3.8 cm and width of 5.3 cm. A homogeneous myometrium is seen with no focal abnormality identified  Endometrium: The endometrium is homogeneously echogenic with a width of 9 mm.  No areas of focal thickening or heterogeneity are seen.  Right ovary:  Measures 3.4 x 2.6 x 2.9 cm (13.3 cc) with no focal abnormalities.  Several sub centimeter follicles are identified but number less than 12.  No follicles measuring greater than 10 mm are seen  Left ovary: Measures 4.3 x 3.9 x 2.6 cm (22.6 cc) with no focal abnormality.  Several sub centimeter follicles are identified but number less than 12.  No follicles measuring greater than 10 mm are seen  Other findings: No pelvic fluid or separate adnexal masses are seen.  IMPRESSION: Normal uterine myometrium and endometrium.  Ovaries are compatible with polycystic ovaries based on ovarian volume alone.  Both ovaries demonstrate several sub centimeter follicles but these number less than 12 (the diagnostic criteria for polycystic ovaries).   Original Report Authenticated By: Rhodia Albright, M.D.     Assessment and Plan  Anovulatory bleeding- probable PCOS Sprintec BCP #3 packs; take one pill 2 x daily until bleeding stops then 1 daily through  2nd pack F/u in GYN clinic  [facility] 06/17/2012, 12:59 PM

## 2012-06-17 NOTE — MAU Note (Signed)
Been bleeding for 3 months and 3 days.  Been having sharp pains off and on (coiincides with bleeding).  Pain is lower pelvic up right side up under breast.  Had this same kind of pain in ?Jan/Feb was seen at [facility].

## 2012-06-18 LAB — GC/CHLAMYDIA PROBE AMP
CT Probe RNA: NEGATIVE
GC Probe RNA: NEGATIVE

## 2012-07-14 ENCOUNTER — Encounter: Payer: Self-pay | Admitting: Medical

## 2013-04-17 ENCOUNTER — Emergency Department (HOSPITAL_COMMUNITY)
Admission: EM | Admit: 2013-04-17 | Discharge: 2013-04-17 | Disposition: A | Discharge status: Home / Self Care | Payer: Medicaid Other | Attending: Emergency Medicine | Admitting: Emergency Medicine

## 2013-04-17 ENCOUNTER — Emergency Department (HOSPITAL_COMMUNITY): Payer: Medicaid Other

## 2013-04-17 ENCOUNTER — Encounter (HOSPITAL_COMMUNITY): Payer: Self-pay | Admitting: Emergency Medicine

## 2013-04-17 DIAGNOSIS — F17200 Nicotine dependence, unspecified, uncomplicated: Secondary | ICD-10-CM | POA: Insufficient documentation

## 2013-04-17 DIAGNOSIS — K089 Disorder of teeth and supporting structures, unspecified: Secondary | ICD-10-CM | POA: Insufficient documentation

## 2013-04-17 DIAGNOSIS — K0889 Other specified disorders of teeth and supporting structures: Secondary | ICD-10-CM

## 2013-04-17 DIAGNOSIS — R599 Enlarged lymph nodes, unspecified: Secondary | ICD-10-CM | POA: Insufficient documentation

## 2013-04-17 DIAGNOSIS — Z3202 Encounter for pregnancy test, result negative: Secondary | ICD-10-CM | POA: Insufficient documentation

## 2013-04-17 DIAGNOSIS — K006 Disturbances in tooth eruption: Secondary | ICD-10-CM | POA: Insufficient documentation

## 2013-04-17 DIAGNOSIS — R630 Anorexia: Secondary | ICD-10-CM | POA: Insufficient documentation

## 2013-04-17 DIAGNOSIS — Z8619 Personal history of other infectious and parasitic diseases: Secondary | ICD-10-CM | POA: Insufficient documentation

## 2013-04-17 LAB — URINALYSIS, ROUTINE W REFLEX MICROSCOPIC
Bilirubin Urine: NEGATIVE
GLUCOSE, UA: NEGATIVE mg/dL
HGB URINE DIPSTICK: NEGATIVE
Ketones, ur: NEGATIVE mg/dL
LEUKOCYTES UA: NEGATIVE
Nitrite: NEGATIVE
PH: 7 (ref 5.0–8.0)
Protein, ur: NEGATIVE mg/dL
SPECIFIC GRAVITY, URINE: 1.019 (ref 1.005–1.030)
Urobilinogen, UA: 0.2 mg/dL (ref 0.0–1.0)

## 2013-04-17 LAB — RAPID URINE DRUG SCREEN, HOSP PERFORMED
AMPHETAMINES: NOT DETECTED
BENZODIAZEPINES: NOT DETECTED
Barbiturates: NOT DETECTED
COCAINE: NOT DETECTED
OPIATES: NOT DETECTED
TETRAHYDROCANNABINOL: NOT DETECTED

## 2013-04-17 LAB — BASIC METABOLIC PANEL
BUN: 8 mg/dL (ref 6–23)
CALCIUM: 9.6 mg/dL (ref 8.4–10.5)
CHLORIDE: 100 meq/L (ref 96–112)
CO2: 24 meq/L (ref 19–32)
Creatinine, Ser: 0.73 mg/dL (ref 0.50–1.10)
GFR calc Af Amer: >90 mL/min (ref >90)
GFR calc non Af Amer: >90 mL/min (ref >90)
GLUCOSE: 88 mg/dL (ref 70–99)
Potassium: 4.3 mEq/L (ref 3.7–5.3)
SODIUM: 138 meq/L (ref 137–147)

## 2013-04-17 LAB — CBC WITH DIFFERENTIAL/PLATELET
Basophils Absolute: 0.1 10*3/uL (ref 0.0–0.1)
Basophils Relative: 0 % (ref 0–1)
EOS PCT: 4 % (ref 0–5)
Eosinophils Absolute: 0.5 10*3/uL (ref 0.0–0.7)
HEMATOCRIT: 38.8 % (ref 36.0–46.0)
Hemoglobin: 13.4 g/dL (ref 12.0–15.0)
LYMPHS ABS: 3 10*3/uL (ref 0.7–4.0)
LYMPHS PCT: 24 % (ref 12–46)
MCH: 32.4 pg (ref 26.0–34.0)
MCHC: 34.5 g/dL (ref 30.0–36.0)
MCV: 93.7 fL (ref 78.0–100.0)
Monocytes Absolute: 1 10*3/uL (ref 0.1–1.0)
Monocytes Relative: 8 % (ref 3–12)
NEUTROS ABS: 7.8 10*3/uL — AB (ref 1.7–7.7)
Neutrophils Relative %: 63 % (ref 43–77)
PLATELETS: 273 10*3/uL (ref 150–400)
RBC: 4.14 MIL/uL (ref 3.87–5.11)
RDW: 13.2 % (ref 11.5–15.5)
WBC: 12.3 10*3/uL — AB (ref 4.0–10.5)

## 2013-04-17 LAB — POC URINE PREG, ED: Preg Test, Ur: NEGATIVE

## 2013-04-17 MED ORDER — ONDANSETRON HCL 4 MG/2ML IJ SOLN
4.0000 mg | Freq: Once | INTRAMUSCULAR | Status: AC
  Administered 2013-04-17: 4 mg via INTRAVENOUS
  Filled 2013-04-17: qty 2

## 2013-04-17 MED ORDER — HYDROCODONE-ACETAMINOPHEN 5-325 MG PO TABS: 1.0000 | ORAL_TABLET | ORAL | Status: DC | PRN

## 2013-04-17 MED ORDER — MORPHINE SULFATE 4 MG/ML IJ SOLN
4.0000 mg | Freq: Once | INTRAMUSCULAR | Status: AC
  Administered 2013-04-17: 4 mg via INTRAVENOUS
  Filled 2013-04-17: qty 1

## 2013-04-17 MED ORDER — IOHEXOL 300 MG/ML  SOLN
80.0000 mL | Freq: Once | INTRAMUSCULAR | Status: AC | PRN
  Administered 2013-04-17: 80 mL via INTRAVENOUS

## 2013-04-17 MED ORDER — AMOXICILLIN 500 MG PO CAPS: 500.0000 mg | ORAL_CAPSULE | Freq: Three times a day (TID) | ORAL | Status: DC

## 2013-04-17 NOTE — ED Provider Notes (Signed)
CSN: [id number]     Arrival date & time 04/17/13  1740 History  This chart was scribed for non-physician practitioner working with Arthor CaptainAbigail Averey Koning, by Tana ConchStephen Methvin ED Scribe. This patient was seen in room WTR5/WTR5 and the patient's care was started at 6:29 PM.    Chief Complaint  Patient presents with  . Dental Pain     HPI  HPI Comments: Mercedes Riley is a 28 y.o. female who presents to the Emergency Department complaining of severe dental pain that she believes is due to her wisdom teeth that have not been removed. Pt reports that pain is recurrent, but previous episodes were resolved quickly . Pain is located in right jaw and radiates to the throat, pt states that it began 2 days ago and has prevented her from swallowing solid food. Pt reports that she is able to swallow liquids. Pt reports that she cannot open her mouth without experiencing severe pain. Pt denies fever, vomiting, and dyspnea.   Past Medical History  Diagnosis Date  . Chlamydia    Past Surgical History  Procedure Laterality Date  . Dilation and curettage of uterus     Family History  Problem Relation Age of Onset  . Diabetes Mother   . Asthma Brother   . Seizures Paternal Grandmother    History  Substance Use Topics  . Smoking status: Current Every Day Smoker  . Smokeless tobacco: Not on file  . Alcohol Use: No   OB History   Grav Para Term Preterm Abortions TAB SAB Ect Mult Living   1 0   1  1   0     Review of Systems  Constitutional: Positive for appetite change (cannot swallow food). Negative for fever.  HENT: Positive for dental problem (cannot open mouth), facial swelling (right side) and trouble swallowing.         Allergies  Review of patient's allergies indicates no known allergies.  Home Medications   Current Outpatient Rx  Name  Route  Sig  Dispense  Refill  . norgestimate-ethinyl estradiol (ORTHO-CYCLEN,SPRINTEC,PREVIFEM) 0.25-35 MG-MCG tablet      Take one table 2 times  daily until bleeding stops then one table daily   3 Package   3    BP 115/74  Pulse 84  Temp(Src) 98.6 F (37 C) (Oral)  Resp 16  SpO2 100% Physical Exam  Constitutional: She appears well-developed and well-nourished. No distress.  HENT:  Head: Normocephalic and atraumatic.  Mouth/Throat: There is trismus in the jaw. Posterior oropharyngeal edema present.    No sweliing pharnyx Exquisitely tender gingiva. Erupting wisdom tooth.   Eyes: Conjunctivae are normal.  Neck:    Positive R tonsilar lmyphadenopathy, TTP Tender to palpation along the Right SCM medial border and trachea.   Cardiovascular: Normal rate, regular rhythm and normal heart sounds.   Pulmonary/Chest: Effort normal and breath sounds normal.  No stridor  Abdominal: Soft.  Lymphadenopathy:    She has cervical adenopathy.    ED Course  Procedures (including critical care time)  DIAGNOSTIC STUDIES: Oxygen Saturation is 100% on RA, normal by my interpretation.    COORDINATION OF CARE:  6:34 PM-Discussed treatment plan to rule out Ludwig's Angina which includes pain medication, an IV, labs, and  CT scan of throat to check for infection. Pt refuses shot for pain and is ok with receiving an IV. Discussed with pt at bedside and pt agreed to plan.      Labs Review Labs Reviewed - No data  to display Imaging Review No results found.   EKG Interpretation None      MDM   Final diagnoses:  None    Patient with dental pain. Pain with swallowing. I have concern for  possible  ludwigs angina.  Ct soft tissue neck in process. Negative  Urine preg.   i have given report to PA [facility] who will assume care of the patient  I personally performed the services described in this documentation, which was scribed in my presence. The recorded information has been reviewed and is accurate.        Arthor Captain, PA-C 04/18/13 1406

## 2013-04-17 NOTE — ED Provider Notes (Signed)
2000 - Patient care assumed from [facility], PA-C change. Patient presenting for dental pain and pain with swallowing. Patient with tenderness to palpation to her anterior neck on physical exam. CT soft tissue neck pending for further evaluation of symptoms. Plan discussed with Tiburcio Pea, PA-C which includes discharge with antibiotics if imaging negative.  2200 - Imaging negative for dental infection or abscess. No signs of ludwig's angina. Patient stable for d/c with Rx for Amoxicillin and Norco for pain. Dental referral and resource guide provided. Patient agreeable to plan with no unaddressed concerns.   Results for orders placed during the hospital encounter of 04/17/13  CBC WITH DIFFERENTIAL      Result Value Ref Range   WBC 12.3 (*) 4.0 - 10.5 K/uL   RBC 4.14  3.87 - 5.11 MIL/uL   Hemoglobin 13.4  12.0 - 15.0 g/dL   HCT 81.1  91.4 - 78.2 %   MCV 93.7  78.0 - 100.0 fL   MCH 32.4  26.0 - 34.0 pg   MCHC 34.5  30.0 - 36.0 g/dL   RDW 95.6  21.3 - 08.6 %   Platelets 273  150 - 400 K/uL   Neutrophils Relative % 63  43 - 77 %   Neutro Abs 7.8 (*) 1.7 - 7.7 K/uL   Lymphocytes Relative 24  12 - 46 %   Lymphs Abs 3.0  0.7 - 4.0 K/uL   Monocytes Relative 8  3 - 12 %   Monocytes Absolute 1.0  0.1 - 1.0 K/uL   Eosinophils Relative 4  0 - 5 %   Eosinophils Absolute 0.5  0.0 - 0.7 K/uL   Basophils Relative 0  0 - 1 %   Basophils Absolute 0.1  0.0 - 0.1 K/uL  BASIC METABOLIC PANEL      Result Value Ref Range   Sodium 138  137 - 147 mEq/L   Potassium 4.3  3.7 - 5.3 mEq/L   Chloride 100  96 - 112 mEq/L   CO2 24  19 - 32 mEq/L   Glucose, Bld 88  70 - 99 mg/dL   BUN 8  6 - 23 mg/dL   Creatinine, Ser 5.78  0.50 - 1.10 mg/dL   Calcium 9.6  8.4 - 46.9 mg/dL   GFR calc non Af Amer >90  >90 mL/min   GFR calc Af Amer >90  >90 mL/min  URINALYSIS, ROUTINE W REFLEX MICROSCOPIC      Result Value Ref Range   Color, Urine YELLOW  YELLOW   APPearance CLOUDY (*) CLEAR   Specific Gravity, Urine 1.019   1.005 - 1.030   pH 7.0  5.0 - 8.0   Glucose, UA NEGATIVE  NEGATIVE mg/dL   Hgb urine dipstick NEGATIVE  NEGATIVE   Bilirubin Urine NEGATIVE  NEGATIVE   Ketones, ur NEGATIVE  NEGATIVE mg/dL   Protein, ur NEGATIVE  NEGATIVE mg/dL   Urobilinogen, UA 0.2  0.0 - 1.0 mg/dL   Nitrite NEGATIVE  NEGATIVE   Leukocytes, UA NEGATIVE  NEGATIVE  URINE RAPID DRUG SCREEN (HOSP PERFORMED)      Result Value Ref Range   Opiates NONE DETECTED  NONE DETECTED   Cocaine NONE DETECTED  NONE DETECTED   Benzodiazepines NONE DETECTED  NONE DETECTED   Amphetamines NONE DETECTED  NONE DETECTED   Tetrahydrocannabinol NONE DETECTED  NONE DETECTED   Barbiturates NONE DETECTED  NONE DETECTED  POC URINE PREG, ED      Result Value Ref Range   Preg  Test, Ur NEGATIVE  NEGATIVE   Ct Soft Tissue Neck W Contrast  04/17/2013   CLINICAL DATA:  Dental pain.  Right jaw pain  EXAM: CT NECK WITH CONTRAST  TECHNIQUE: Multidetector CT imaging of the neck was performed using the standard protocol following the bolus administration of intravenous contrast.  CONTRAST:  80mL OMNIPAQUE IOHEXOL 300 MG/ML  SOLN  COMPARISON:  None.  FINDINGS: Visualized intracranial contents are normal. Orbit is normal. Paranasal sinuses are clear. The skullbase intact.  No evidence of acute dental infection or periapical abscess. No evidence of cellulitis or soft tissue swelling.  The tongue and tonsils are normal. Larynx is normal. The thyroid, submandibular, and parotid glands are normal.  Right level 2B lymph node is mildly enlarged measuring 15 mm. No other enlarged lymph nodes are identified. Lung apices are clear.  IMPRESSION: Negative for acute dental infection or abscess. No mass lesion. Right level 2B lymph node is mildly enlarged measuring 15 mm.   Electronically Signed   By: Marlan Palauharles  Clark M.D.   On: 04/17/2013 21:46      Antony MaduraKelly Oliva Montecalvo, PA-C 04/17/13 2203

## 2013-04-17 NOTE — ED Provider Notes (Signed)
Medical screening examination/treatment/procedure(s) were performed by non-physician practitioner and as supervising physician I was immediately available for consultation/collaboration.   EKG Interpretation None       Pax Reasoner, MD 04/17/13 2359 

## 2013-04-17 NOTE — Discharge Instructions (Signed)
Dental Pain °A tooth ache may be caused by cavities (tooth decay). Cavities expose the nerve of the tooth to air and hot or cold temperatures. It may come from an infection or abscess (also called a boil or furuncle) around your tooth. It is also often caused by dental caries (tooth decay). This causes the pain you are having. °DIAGNOSIS  °Your caregiver can diagnose this problem by exam. °TREATMENT  °· If caused by an infection, it may be treated with medications which kill germs (antibiotics) and pain medications as prescribed by your caregiver. Take medications as directed. °· Only take over-the-counter or prescription medicines for pain, discomfort, or fever as directed by your caregiver. °· Whether the tooth ache today is caused by infection or dental disease, you should see your dentist as soon as possible for further care. °SEEK MEDICAL CARE IF: °The exam and treatment you received today has been provided on an emergency basis only. This is not a substitute for complete medical or dental care. If your problem worsens or new problems (symptoms) appear, and you are unable to meet with your dentist, call or return to this location. °SEEK IMMEDIATE MEDICAL CARE IF:  °· You have a fever. °· You develop redness and swelling of your face, jaw, or neck. °· You are unable to open your mouth. °· You have severe pain uncontrolled by pain medicine. °MAKE SURE YOU:  °· Understand these instructions. °· Will watch your condition. °· Will get help right away if you are not doing well or get worse. °Document Released: 01/22/2005 Document Revised: 04/16/2011 Document Reviewed: 09/10/2007 °ExitCare® Patient Information ©2014 [employer]. °  Emergency Department Resource Guide °1) Find a Doctor and Pay Out of Pocket °Although you won't have to find out who is covered by your insurance plan, it is a good idea to ask around and get recommendations. You will then need to call the office and see if the doctor you have chosen will  accept you as a new patient and what types of options they offer for patients who are self-pay. Some doctors offer discounts or will set up payment plans for their patients who do not have insurance, but you will need to ask so you aren't surprised when you get to your appointment. ° °2) Contact Your Local Health Department °Not all health departments have doctors that can see patients for sick visits, but many do, so it is worth a call to see if yours does. If you don't know where your local health department is, you can check in your phone book. The CDC also has a tool to help you locate your state's health department, and many state websites also have listings of all of their local health departments. ° °3) Find a Walk-in Clinic °If your illness is not likely to be very severe or complicated, you may want to try a walk in clinic. These are popping up all over the country in pharmacies, drugstores, and shopping centers. They're usually staffed by nurse practitioners or physician assistants that have been trained to treat common illnesses and complaints. They're usually fairly quick and inexpensive. However, if you have serious medical issues or chronic medical problems, these are probably not your best option. ° °No Primary Care Doctor: °- Call [facility] at  832-8000 - they can help you locate a primary care doctor that  accepts your insurance, provides certain services, etc. °- Physician Referral Service- 1-800-533-3463 ° °Chronic Pain Problems: °Organization           Address  Phone   Notes  °[facility]  (336) 297-2271 Patients need to be referred by their primary care doctor.  ° °Medication Assistance: °Organization         Address  Phone   Notes  °[place] [facility] [address] °[place], [place] [address] (336) 641-8030 --Must be a resident of [place] °-- Must have NO insurance coverage whatsoever (no Medicaid/ Medicare, etc.) °-- The pt.  MUST have a primary care doctor that directs their care regularly and follows them in the community °  °[facility]  (866) 331-1348   °[employer]  (888) 892-1162   ° °Agencies that provide inexpensive medical care: °Organization         Address  Phone   Notes  °[facility]  (336) 832-8035   °[facility]    (336) 832-7272   °[facility] [address] °[place], [place] [address] (336) 832-4777   °[facility] [address], °[place] (336) 271-4999   °[facility]    (336) 373-0678   °[facility]    (336) 272-1050   °[facility] ° [address], [place] Phone:  (336) 832-4444, Fax:  (336) 832-4440 Hours of Operation:  9 am - 6 pm, M-F.  Also accepts Medicaid/Medicare and self-pay.  °[facility] ° [address], [place] Phone: (336) 832-3150, Fax: (336) 832-3151. Hours of Operation:  8:30 am - 5:30 pm, M-F.  Also accepts Medicaid and self-pay.  °[facility] [place] [address], [place] Phone: (336) 878-6027   °[facility] [address], [place], [place] (336)723-1848, Ext. 123 Mondays & Thursdays: 7-9 AM.  First 15 patients are seen on a first come, first serve basis. °  ° °Medicaid-accepting [place] Providers: ° °Organization         Address  Phone   Notes  °[facility] [address], [place] (336) 641-2100 Also accepts self-pay patients.  °[facility] [address], [place] ° (336) 856-9996   °[facility] [address], [place] (336) 288-8857   °[facility] [address] [place] [address], [place] (336) 299-7000   °Veita Bland [address], [place]  ° (336) 373-1557 Only accepts [place] Access Medicaid patients after they have their name applied to their card.  ° °Self-Pay (no insurance) in  [place]: ° °Organization         Address  Phone   Notes  °Sickle Cell Patients, [facility] [address], [place] (336) 832-1970   °[facility] [address], [place] (336) 832-4400   °[facility] ° [address], [place] (336) 992-4800   °[facility]/Dr. Osei-Bonsu ° [address] [place] [address], [place] or [address], [place] (336) 841-8500 Phone number for both [place] and [place] locations is the same.  °[facility] [address], [place] (336) 299-0000   °[facility] [address] [place] [address], [place] or [address] (336) 852-7530 °(336) 878-2260   °[facility] [address],  (336) 350-1642, phone; (336) 294-5005, fax Sees patients 1st and 3rd Saturday of every month.  Must not qualify   for public or private insurance (i.e. Medicaid, Medicare, [place] Health Choice, Veterans' Benefits) • Household income should be no more than 200% of the poverty level •The clinic cannot treat you if you are pregnant or think you are pregnant • Sexually transmitted diseases are not treated at the clinic.  ° °Dental Care: °Organization         Address  Phone  Notes  °[place] [facility] [address], [place] (336) 641-6152 Accepts children up to age 21 who are enrolled in Medicaid or [place] Health Choice; pregnant women with a Medicaid card; and children who have applied for Medicaid or [place] Health Choice, but were declined, whose parents can pay a reduced fee at time of service.  °[facility] [place]  [address], [place] (336) 641-7733 Accepts children up to age 21 who are enrolled in Medicaid or [place] Health Choice; pregnant women with a Medicaid card; and children who have applied for Medicaid or [place] Health Choice, but were declined, whose parents  can pay a reduced fee at time of service.  °[facility] ° [address], [place] (336) 641-4533 Patients are seen by appointment only. Walk-ins are not accepted. [facility] will see patients 18 years of age and older. °Monday - Tuesday (8am-5pm) °Most Wednesdays (8:30-5pm) °$30 per visit, cash only  °[facility] ° [address], [place] (336) 641-4533 Patients are seen by appointment only. Walk-ins are not accepted. [facility] will see patients 18 years of age and older. °One Wednesday Evening (Monthly: Volunteer Based).  $30 per visit, cash only  °[facility]  (919) 537-3737 for adults; Children under age 4, call Graduate Pediatric Dentistry at (919) [phone]. Children aged 4-14, please call (919) [phone] to request a pediatric application. ° Dental services are provided in all areas of dental care including fillings, crowns and bridges, complete and partial dentures, implants, gum treatment, root canals, and extractions. Preventive care is also provided. Treatment is provided to both adults and children. °Patients are selected via a lottery and there is often a waiting list. °  °[facility] [address], °[place] ° (336) 763-8833 [email or web address] °  °[facility] [address], [place], [place] (336)723-1848, Ext. 123 Second and Fourth Thursday of each month, opens at 6:30 AM; Clinic ends at 9 AM.  Patients are seen on a first-come first-served basis, and a limited number are seen during each clinic.  ° °[facility] ° [address], [place], [place] (336) 723-7904   Eligibility Requirements °You must have lived in [place], [place], or [place] counties for at least the last three months. °  You cannot be eligible for state or federal sponsored healthcare insurance, including Veterans Administration, Medicaid, or Medicare. °  You generally cannot be eligible for healthcare  insurance through your employer.  °  How to apply: °Eligibility screenings are held every Tuesday and Wednesday afternoon from 1:00 pm until 4:00 pm. You do not need an appointment for the interview!  °[facility] [address], [place],  336-631-2330   °[facility] [facility]  336-342-8273   °[facility]  336-703-3100   °[facility]  336-570-6415   ° °Behavioral Health Resources in the Community: °Intensive Outpatient Programs °Organization         Address  Phone    Notes  °[facility] [address], [place], [place] 336-878-6098   °[facility] [address], [place], [place] 336-832-9800   °[facility] [address], [place], [place] ° 336-882-2125   °[facility] [address],  °[place], [place] 1-800-853-5163 or 336-641-4981   °Substance Abuse Resources °Organization         Address  Phone  Notes  °[facility]  336-882-2125   °[facility]  336-784-9470   °[facility]  336-285-9073   °[facility]  336-845-3988   °Residential & Outpatient Substance Abuse Program  1-800-659-3381   °Psychological Services °Organization         Address  Phone  Notes  °[facility]  336- 832-9600   °[facility]  336- 378-7881   °[facility] [address], [place] 1-800-853-5163 or 336-641-4981   ° °Mobile Crisis Teams °Organization         Address  Phone  Notes  °[facility], Mobile Crisis Care Unit  1-877-626-1772   °Assertive °[facility] ° [address] [place], [place] 336-834-9664   °Sharon DeEsch [address] °[place] 336-554-5454   ° °Self-Help/Support Groups °Organization         Address  Phone             Notes  °[facility] - variety of support groups  336- 373-1402 Call for more information  °Narcotics Anonymous (NA),  [facility] [address], °[place]  2 meetings at this location  ° °Residential Treatment Programs °Organization         Address  Phone  Notes  °[facility] [address],    °[place]  1-866-801-8205   °[facility] ° [address], [place], [place] 704-293-8524   °[facility] [address], [place] 336-845-3988 Admissions: 8am-3pm M-F  °[facility] [address]    °[place], [place] 336-841-1104   °[facility] [address], [place], [place] 336-379-7146   °[facility] [address]  °[place], [place] 336-285-9073   °[facility] - Intensive Outpatient [address], [place], [place] 336-852-3033   °[facility] [address]  °[place], [place] 1-877-615-2722 or 336-784-9470   °[facility] [address] [place], [place] 336-227-7417 Accepts Medicaid  °[facility] [address]  °[place] 1-800-659-3381 Substance Abuse/Addiction Treatment  ° °[place] Behavioral Health Resources °Organization         Address  Phone  Notes  °[facility]  (888) 581-9988   °Julie Brannon, PhD [address] [place], [place]   (336) 349-5553 or (336) 951-0000   °[facility]   [address] °[place], [place] (336) 349-4454   °[facility] [address], [place], [place] (336) 342-8316 Insurance/Medicaid/sponsorship through [facility]  °[facility] [address]                                    [place],  (336) 342-8316 Therapy/tele-psych/case  °[facility] [address]  ° [place],  (336) 349-2233    °Dr. Arfeen  (336) 349-4544   °[facility] [place]  [employer] [place]   [facility] 1) [address], [place] °2) [address], [place] °3)  [address], [place] (336) 349-3220 °(336) 342-7768 ° °(336) 342-8140     °[place] Child Abuse [facility] (336) 342-1394 or (336) 342-3537 (After Hours)    ° °   °

## 2013-04-17 NOTE — ED Notes (Signed)
Pt states she thinks her wisdom teeth are coming in on the R lower side of her mouth. Pt reports pain and swelling to the area for the past 2 days. Pt alert, no acute distress.

## 2013-04-19 NOTE — ED Provider Notes (Signed)
Medical screening examination/treatment/procedure(s) were performed by non-physician practitioner and as supervising physician I was immediately available for consultation/collaboration.   EKG Interpretation None       Winter Trefz, MD 04/19/13 2001 

## 2013-06-27 ENCOUNTER — Inpatient Hospital Stay (HOSPITAL_COMMUNITY)
Admission: AD | Admit: 2013-06-27 | Discharge: 2013-06-27 | Disposition: A | Discharge status: Home / Self Care | Payer: Self-pay | Source: Ambulatory Visit | Attending: Obstetrics & Gynecology | Admitting: Obstetrics & Gynecology

## 2013-06-27 ENCOUNTER — Inpatient Hospital Stay (HOSPITAL_COMMUNITY): Payer: Medicaid Other

## 2013-06-27 ENCOUNTER — Encounter (HOSPITAL_COMMUNITY): Payer: Self-pay | Admitting: *Deleted

## 2013-06-27 DIAGNOSIS — O2390 Unspecified genitourinary tract infection in pregnancy, unspecified trimester: Secondary | ICD-10-CM | POA: Insufficient documentation

## 2013-06-27 DIAGNOSIS — O99330 Smoking (tobacco) complicating pregnancy, unspecified trimester: Secondary | ICD-10-CM | POA: Insufficient documentation

## 2013-06-27 DIAGNOSIS — A499 Bacterial infection, unspecified: Secondary | ICD-10-CM | POA: Insufficient documentation

## 2013-06-27 DIAGNOSIS — N760 Acute vaginitis: Secondary | ICD-10-CM | POA: Insufficient documentation

## 2013-06-27 DIAGNOSIS — B9689 Other specified bacterial agents as the cause of diseases classified elsewhere: Secondary | ICD-10-CM | POA: Insufficient documentation

## 2013-06-27 DIAGNOSIS — O209 Hemorrhage in early pregnancy, unspecified: Secondary | ICD-10-CM | POA: Insufficient documentation

## 2013-06-27 DIAGNOSIS — R1011 Right upper quadrant pain: Secondary | ICD-10-CM | POA: Insufficient documentation

## 2013-06-27 LAB — URINALYSIS, ROUTINE W REFLEX MICROSCOPIC
BILIRUBIN URINE: NEGATIVE
GLUCOSE, UA: NEGATIVE mg/dL
KETONES UR: NEGATIVE mg/dL
Leukocytes, UA: NEGATIVE
NITRITE: NEGATIVE
PH: 5.5 (ref 5.0–8.0)
Protein, ur: NEGATIVE mg/dL
Urobilinogen, UA: 0.2 mg/dL (ref 0.0–1.0)

## 2013-06-27 LAB — CBC
HCT: 36.2 % (ref 36.0–46.0)
Hemoglobin: 12.1 g/dL (ref 12.0–15.0)
MCH: 31.7 pg (ref 26.0–34.0)
MCHC: 33.4 g/dL (ref 30.0–36.0)
MCV: 94.8 fL (ref 78.0–100.0)
PLATELETS: 214 10*3/uL (ref 150–400)
RBC: 3.82 MIL/uL — AB (ref 3.87–5.11)
RDW: 12.8 % (ref 11.5–15.5)
WBC: 10.7 10*3/uL — AB (ref 4.0–10.5)

## 2013-06-27 LAB — URINE MICROSCOPIC-ADD ON

## 2013-06-27 LAB — HCG, QUANTITATIVE, PREGNANCY: hCG, Beta Chain, Quant, S: 16494 m[IU]/mL — ABNORMAL HIGH (ref <5)

## 2013-06-27 LAB — POCT PREGNANCY, URINE: Preg Test, Ur: POSITIVE — AB

## 2013-06-27 LAB — ABO/RH: ABO/RH(D): O POS

## 2013-06-27 NOTE — MAU Provider Note (Signed)
History     CSN: [id number]  Arrival date and time: 06/27/13 0341   First Provider Initiated Contact with Patient 06/27/13 0428      No chief complaint on file.  HPI [name] is a 28 y.o. G2P0010 at [redacted]w[redacted]d who presents to MAU today with complaint of RUQ pain spotting. The patient states that she noted RUQ pain occasionally since this morning. She woke up at 0300 and went to the bathroom and noted pink spotting with wiping. She has not had to wear a pad. She denies lower abdominal pain or fever. She has mild nausea occasionally without vomiting, diarrhea or constipation. She is still having a slight discharge, but is currently on Flagyl for BV and was treated at [facility] for Chlamydia yesterday.   OB History   Grav Para Term Preterm Abortions TAB SAB Ect Mult Living   2 0   1  1   0      Past Medical History  Diagnosis Date  . Chlamydia     Past Surgical History  Procedure Laterality Date  . Dilation and curettage of uterus      Family History  Problem Relation Age of Onset  . Diabetes Mother   . Asthma Brother   . Seizures Paternal Grandmother     History  Substance Use Topics  . Smoking status: Current Every Day Smoker  . Smokeless tobacco: Not on file  . Alcohol Use: No    Allergies: No Known Allergies  Prescriptions prior to admission  Medication Sig Dispense Refill  . metroNIDAZOLE (FLAGYL) 250 MG tablet Take 250 mg by mouth 3 (three) times daily.      Marland Kitchen amoxicillin (AMOXIL) 500 MG capsule Take 1 capsule (500 mg total) by mouth 3 (three) times daily.  21 capsule  0  . HYDROcodone-acetaminophen (NORCO/VICODIN) 5-325 MG per tablet Take 1 tablet by mouth every 4 (four) hours as needed.  13 tablet  0  . norgestimate-ethinyl estradiol (ORTHO-CYCLEN,SPRINTEC,PREVIFEM) 0.25-35 MG-MCG tablet Take 2 tabs twice daily until bleeding stops then take 1 tab once daily when bleeding lasts longer than usual.        Review of Systems  Constitutional: Negative for  fever and malaise/fatigue.  Gastrointestinal: Positive for nausea and abdominal pain. Negative for vomiting, diarrhea and constipation.  Genitourinary: Negative for dysuria, urgency and frequency.   Physical Exam   Blood pressure 109/67, pulse 73, temperature 98.1 F (36.7 C), temperature source Oral, resp. rate 20, height 5\' 8"  (1.727 m), weight 208 lb 2 oz (94.405 kg), last menstrual period 04/19/2013.  Physical Exam  Constitutional: She is oriented to person, place, and time. She appears well-developed and well-nourished. No distress.  HENT:  Head: Normocephalic and atraumatic.  Cardiovascular: Normal rate.   Respiratory: Effort normal.  GI: Soft. Bowel sounds are normal. She exhibits no distension and no mass. There is no tenderness. There is no rebound and no guarding.  Genitourinary: Uterus is not tender. Cervix exhibits no motion tenderness, no discharge and no friability. There is bleeding (light pink) around the vagina. Vaginal discharge (small amount of frothy white/pink discharge noted) found.  Neurological: She is alert and oriented to person, place, and time.  Skin: Skin is warm and dry. No erythema.  Psychiatric: She has a normal mood and affect.  Cervix: closed, thick  Results for orders placed during the hospital encounter of 06/27/13 (from the past 24 hour(s))  URINALYSIS, ROUTINE W REFLEX MICROSCOPIC     Status: Abnormal  Collection Time    06/27/13  3:50 AM      Result Value Ref Range   Color, Urine YELLOW  YELLOW   APPearance CLEAR  CLEAR   Specific Gravity, Urine >1.030 (*) 1.005 - 1.030   pH 5.5  5.0 - 8.0   Glucose, UA NEGATIVE  NEGATIVE mg/dL   Hgb urine dipstick MODERATE (*) NEGATIVE   Bilirubin Urine NEGATIVE  NEGATIVE   Ketones, ur NEGATIVE  NEGATIVE mg/dL   Protein, ur NEGATIVE  NEGATIVE mg/dL   Urobilinogen, UA 0.2  0.0 - 1.0 mg/dL   Nitrite NEGATIVE  NEGATIVE   Leukocytes, UA NEGATIVE  NEGATIVE  URINE MICROSCOPIC-ADD ON     Status: Abnormal    Collection Time    06/27/13  3:50 AM      Result Value Ref Range   Squamous Epithelial / LPF FEW (*) RARE   WBC, UA 0-2  <3 WBC/hpf   RBC / HPF 0-2  <3 RBC/hpf   Bacteria, UA RARE  RARE  POCT PREGNANCY, URINE     Status: Abnormal   Collection Time    06/27/13  3:59 AM      Result Value Ref Range   Preg Test, Ur POSITIVE (*) NEGATIVE  HCG, QUANTITATIVE, PREGNANCY     Status: Abnormal   Collection Time    06/27/13  4:45 AM      Result Value Ref Range   hCG, Beta Chain, Sharene Butters, [place] [address] (*) <5 mIU/mL  CBC     Status: Abnormal   Collection Time    06/27/13  4:45 AM      Result Value Ref Range   WBC 10.7 (*) 4.0 - 10.5 K/uL   RBC 3.82 (*) 3.87 - 5.11 MIL/uL   Hemoglobin 12.1  12.0 - 15.0 g/dL   HCT 69.6  29.5 - 28.4 %   MCV 94.8  78.0 - 100.0 fL   MCH 31.7  26.0 - 34.0 pg   MCHC 33.4  30.0 - 36.0 g/dL   RDW 13.2  44.0 - 10.2 %   Platelets 214  150 - 400 K/uL  ABO/RH     Status: None   Collection Time    06/27/13  4:45 AM      Result Value Ref Range   ABO/RH(D) O POS     US Ob Comp Less 14 Wks  06/27/2013   CLINICAL DATA:  Abdominal pain and vaginal bleeding.  EXAM: OBSTETRIC <14 WK [place] AND TRANSVAGINAL OB US  TECHNIQUE: Both transabdominal and transvaginal ultrasound examinations were performed for complete evaluation of the gestation as well as the maternal uterus, adnexal regions, and pelvic cul-de-sac. Transvaginal technique was performed to assess early pregnancy.  COMPARISON:  None.  FINDINGS: Intrauterine gestational sac: Visualized/normal in shape.  Yolk sac:  Yes  Embryo:  No  Cardiac Activity: N/A  MSD: 1.47 cm   6 w   2  d  Maternal uterus/adnexae: No subchorionic hemorrhage is noted. The uterus is otherwise unremarkable in appearance.  The ovaries are within normal limits. The right ovary measures 4.0 x 2.3 x 2.3 cm, while the left ovary measures 3.4 x 2.3 x 2.7 cm. No suspicious adnexal masses are seen; there is no evidence for ovarian torsion.  No free fluid is seen within  the pelvic cul-de-sac.  IMPRESSION: Single intrauterine gestational sac noted, with a mean sac diameter of 1.5 cm, corresponding to a gestational age of [redacted] weeks 2 days. This does not match the gestational age  of [redacted] weeks 6 days by LMP. A new estimated date of delivery can be provided once the embryo is visualized. The embryo would be better assessed on pelvic ultrasound in 1-2 weeks, as deemed clinically appropriate.   Electronically Signed   By: Roanna RaiderJeffery  Chang M.D.   On: 06/27/2013 05:49   [place] Ob Transvaginal  06/27/2013   CLINICAL DATA:  Abdominal pain and vaginal bleeding.  EXAM: OBSTETRIC <14 WK US AND TRANSVAGINAL OB US  TECHNIQUE: Both transabdominal and transvaginal ultrasound examinations were performed for complete evaluation of the gestation as well as the maternal uterus, adnexal regions, and pelvic cul-de-sac. Transvaginal technique was performed to assess early pregnancy.  COMPARISON:  None.  FINDINGS: Intrauterine gestational sac: Visualized/normal in shape.  Yolk sac:  Yes  Embryo:  No  Cardiac Activity: N/A  MSD: 1.47 cm   6 w   2  d  Maternal uterus/adnexae: No subchorionic hemorrhage is noted. The uterus is otherwise unremarkable in appearance.  The ovaries are within normal limits. The right ovary measures 4.0 x 2.3 x 2.3 cm, while the left ovary measures 3.4 x 2.3 x 2.7 cm. No suspicious adnexal masses are seen; there is no evidence for ovarian torsion.  No free fluid is seen within the pelvic cul-de-sac.  IMPRESSION: Single intrauterine gestational sac noted, with a mean sac diameter of 1.5 cm, corresponding to a gestational age of [redacted] weeks 2 days. This does not match the gestational age of [redacted] weeks 6 days by LMP. A new estimated date of delivery can be provided once the embryo is visualized. The embryo would be better assessed on pelvic ultrasound in 1-2 weeks, as deemed clinically appropriate.   Electronically Signed   By: Roanna RaiderJeffery  Chang M.D.   On: 06/27/2013 05:49     MAU Course   Procedures None  MDM +UPT UA, CBC, ABO/Rh, quant hCG and US today Patient treated yesterday for [facility]lam[facility] and currently taking Flagyl for BV. Cultures not collected today.  Assessment and Plan  A: IUGS and YS at 6861w2d Vaginal bleeding in pregnancy prior to [redacted] weeks gestation Recent chlamydia infection, treated  P: Discharge home Bleeding precautions and first trimester warning signs discussed Pelvic rest advised until bleeding has stopped completely Patient advised to continue taking prenatal vitamins Patient advised to follow-up with [facility] as scheduled for routine prenatal care Patient may return to MAU as needed or if her condition were to change or worsen  Freddi StarrJulie N Ethier, PA-C  06/27/2013, 5:59 AM

## 2013-06-27 NOTE — MAU Note (Addendum)
PT SAYS   TONIGHT  SHE AWOKE    AT 0300 -  B-ROOM  - BRIGHT RED BLOOD  WHEN  WIPED .  IN TRIAGE -   IN  B-ROOM    WHEN SHE WIPED- BRIGHT RED .  NONE ON PERINEUM.  SAYS CRAMPING  STARTED  AT 330PM.   SHE WENT  TO HD ON TUES- TOLD BV- IS TAKING MED.   LAST SEX-  MON.  WAS ON BC PILL- STOPPED IN April   SHE WENT BACK TO HD ON  Thursday  FOR  TX FOR [facility]

## 2013-06-27 NOTE — Discharge Instructions (Signed)
° °Pelvic Rest °Pelvic rest is sometimes recommended for women when:  °· The placenta is partially or completely covering the opening of the cervix (placenta previa). °· There is bleeding between the uterine wall and the amniotic sac in the first trimester (subchorionic hemorrhage). °· The cervix begins to open without labor starting (incompetent cervix, cervical insufficiency). °· The labor is too early (preterm labor). °HOME CARE INSTRUCTIONS °· Do not have sexual intercourse, stimulation, or an orgasm. °· Do not use tampons, douche, or put anything in the vagina. °· Do not lift anything over 10 pounds (4.5 kg). °· Avoid strenuous activity or straining your pelvic muscles. °SEEK MEDICAL CARE IF:  °· You have any vaginal bleeding during pregnancy. Treat this as a potential emergency. °· You have cramping pain felt low in the stomach (stronger than menstrual cramps). °· You notice vaginal discharge (watery, mucus, or bloody). °· You have a low, dull backache. °· There are regular contractions or uterine tightening. °SEEK IMMEDIATE MEDICAL CARE IF: °You have vaginal bleeding and have placenta previa.  °Document Released: 05/19/2010 Document Revised: 04/16/2011 Document Reviewed: 05/19/2010 °ExitCare® Patient Information ©2014 [employer]. ° °Vaginal Bleeding During Pregnancy, First Trimester °A small amount of bleeding (spotting) from the vagina is relatively common in early pregnancy. It usually stops on its own. Various things may cause bleeding or spotting in early pregnancy. Some bleeding may be related to the pregnancy, and some may not. In most cases, the bleeding is normal and is not a problem. However, bleeding can also be a sign of something serious. Be sure to tell your health care provider about any vaginal bleeding right away. °Some possible causes of vaginal bleeding during the first trimester include: °· Infection or inflammation of the cervix. °· Growths (polyps) on the cervix. °· Miscarriage or  threatened miscarriage. °· Pregnancy tissue has developed outside of the uterus and in a fallopian tube (tubal pregnancy). °· Tiny cysts have developed in the uterus instead of pregnancy tissue (molar pregnancy). °HOME CARE INSTRUCTIONS  °Watch your condition for any changes. The following actions may help to lessen any discomfort you are feeling: °· Follow your health care provider's instructions for limiting your activity. If your health care provider orders bed rest, you may need to stay in bed and only get up to use the bathroom. However, your health care provider may allow you to continue light activity. °· If needed, make plans for someone to help with your regular activities and responsibilities while you are on bed rest. °· Keep track of the number of pads you use each day, how often you change pads, and how soaked (saturated) they are. Write this down. °· Do not use tampons. Do not douche. °· Do not have sexual intercourse or orgasms until approved by your health care provider. °· If you pass any tissue from your vagina, save the tissue so you can show it to your health care provider. °· Only take over-the-counter or prescription medicines as directed by your health care provider. °· Do not take aspirin because it can make you bleed. °· Keep all follow-up appointments as directed by your health care provider. °SEEK MEDICAL CARE IF: °· You have any vaginal bleeding during any part of your pregnancy. °· You have cramps or labor pains. °SEEK IMMEDIATE MEDICAL CARE IF:  °· You have severe cramps in your back or belly (abdomen). °· You have a fever, not controlled by medicine. °· You pass large clots or tissue from your vagina. °· Your bleeding   increases. °· You feel lightheaded or weak, or you have fainting episodes. °· You have chills. °· You are leaking fluid or have a gush of fluid from your vagina. °· You pass out while having a bowel movement. °MAKE SURE YOU: °· Understand these instructions. °· Will watch  your condition. °· Will get help right away if you are not doing well or get worse. °Document Released: 11/01/2004 Document Revised: 11/12/2012 Document Reviewed: 09/29/2012 °ExitCare® Patient Information ©2014 [employer]. ° °

## 2013-06-27 NOTE — MAU Provider Note (Signed)
Attestation of Attending Supervision of Advanced Practitioner (CNM/NP): Evaluation and management procedures were performed by the Advanced Practitioner under my supervision and collaboration.  I have reviewed the Advanced Practitioner's note and chart, and I agree with the management and plan.  Lanore Renderos Harraway-Smith 6:46 AM     

## 2013-07-22 LAB — METABOLIC PANEL, COMPREHENSIVE
A-G Ratio: 0.7 — ABNORMAL LOW (ref 1.1–2.2)
ALT (SGPT): 14 U/L (ref 12–78)
AST (SGOT): 17 U/L (ref 15–37)
Albumin: 2.6 g/dL — ABNORMAL LOW (ref 3.5–5.0)
Alk. phosphatase: 79 U/L (ref 45–117)
Anion gap: 9 mmol/L (ref 5–15)
BUN/Creatinine ratio: 21 — ABNORMAL HIGH (ref 12–20)
BUN: 7 MG/DL (ref 6–20)
Bilirubin, total: 0.1 MG/DL — ABNORMAL LOW (ref 0.2–1.0)
CO2: 23 mmol/L (ref 21–32)
Calcium: 8 MG/DL — ABNORMAL LOW (ref 8.5–10.1)
Chloride: 105 mmol/L (ref 97–108)
Creatinine: 0.33 MG/DL — ABNORMAL LOW (ref 0.45–1.15)
GFR est AA: >60 mL/min/{1.73_m2} (ref >60)
GFR est non-AA: >60 mL/min/{1.73_m2} (ref >60)
Globulin: 3.6 g/dL (ref 2.0–4.0)
Glucose: 80 mg/dL (ref 65–100)
Potassium: 3.8 mmol/L (ref 3.5–5.1)
Protein, total: 6.2 g/dL — ABNORMAL LOW (ref 6.4–8.2)
Sodium: 137 mmol/L (ref 136–145)

## 2013-07-22 LAB — URINALYSIS W/ REFLEX CULTURE
Bilirubin: NEGATIVE
Blood: NEGATIVE
Glucose: NEGATIVE mg/dL
Ketone: NEGATIVE mg/dL
Nitrites: NEGATIVE
Protein: NEGATIVE mg/dL
Specific gravity: 1.007 (ref 1.003–1.030)
Urobilinogen: 0.2 EU/dL (ref 0.2–1.0)
pH (UA): 7 (ref 5.0–8.0)

## 2013-07-22 LAB — CBC WITH AUTOMATED DIFF
ABS. BASOPHILS: 0 10*3/uL (ref 0.0–0.1)
ABS. EOSINOPHILS: 0.1 10*3/uL (ref 0.0–0.4)
ABS. LYMPHOCYTES: 2.4 10*3/uL (ref 0.8–3.5)
ABS. MONOCYTES: 0.6 10*3/uL (ref 0.0–1.0)
ABS. NEUTROPHILS: 6 10*3/uL (ref 1.8–8.0)
BASOPHILS: 0 % (ref 0–1)
EOSINOPHILS: 1 % (ref 0–7)
HCT: 33 % — ABNORMAL LOW (ref 35.0–47.0)
HGB: 11.1 g/dL — ABNORMAL LOW (ref 11.5–16.0)
LYMPHOCYTES: 26 % (ref 12–49)
MCH: 31.4 PG (ref 26.0–34.0)
MCHC: 33.6 g/dL (ref 30.0–36.5)
MCV: 93.5 FL (ref 80.0–99.0)
MONOCYTES: 7 % (ref 5–13)
NEUTROPHILS: 66 % (ref 32–75)
PLATELET: 240 10*3/uL (ref 150–400)
RBC: 3.53 M/uL — ABNORMAL LOW (ref 3.80–5.20)
RDW: 13.7 % (ref 11.5–14.5)
WBC: 9.2 10*3/uL (ref 3.6–11.0)

## 2013-07-22 MED ORDER — CEPHALEXIN 500 MG CAP
500 mg | ORAL_CAPSULE | Freq: Three times a day (TID) | ORAL | Status: AC
Start: 2013-07-22 — End: 2013-07-29

## 2013-07-22 MED ADMIN — sodium chloride 0.9 % bolus infusion 1,000 mL: INTRAVENOUS | @ 10:00:00 | NDC 00409798309

## 2013-07-22 MED FILL — BD POSIFLUSH NORMAL SALINE 0.9 % INJECTION SYRINGE: INTRAMUSCULAR | Qty: 10

## 2013-07-22 MED FILL — SODIUM CHLORIDE 0.9 % IV: INTRAVENOUS | Qty: 1000

## 2013-07-22 NOTE — ED Notes (Signed)
Dr. Hughes at bedside discharging patient; instructions reviewed by provider. Patient exited ED prior to RN reassessment.

## 2013-07-22 NOTE — ED Provider Notes (Signed)
HPI Comments: Pt states that she has been under a lot of stress and has been having anxiety. Her OB doctor started her on zoloft 50 mg daily yesterday. Pt too her first dose last night at 2100. Pt states that she has been feeling sick and nauseated all night. Pt is a G2P1 and is currently [redacted] weeks pregnant. Pt denies vaginal bleeding/discharge or abd cramping. Pt denies SI or HI.     Patient is a 28 y.o. female presenting with medication problem. The history is provided by the patient. No language interpreter was used.   Medication Problem   This is a new problem. The current episode started 3 to 5 hours ago. The problem has not changed since onset.The ingested item was antidepressants. Ingestion amount is known. She has not vomited. Associated symptoms include nausea. Pertinent negatives include no vomiting and no suicidal ideas. There were no other medications available.        History reviewed. No pertinent past medical history.     History reviewed. No pertinent past surgical history.      History reviewed. No pertinent family history.     History     Social History   ??? Marital Status: MARRIED     Spouse Name: N/A     Number of Children: N/A   ??? Years of Education: N/A     Occupational History   ??? Not on file.     Social History Main Topics   ??? Smoking status: Not on file   ??? Smokeless tobacco: Not on file   ??? Alcohol Use: Not on file   ??? Drug Use: Not on file   ??? Sexual Activity: Not on file     Other Topics Concern   ??? Not on file     Social History Narrative   ??? No narrative on file                  ALLERGIES: Review of patient's allergies indicates no known allergies.      Review of Systems   Constitutional: Negative for appetite change and unexpected weight change.   HENT: Negative for dental problem, ear discharge, facial swelling, rhinorrhea and trouble swallowing.    Eyes: Negative for pain and discharge.   Respiratory: Negative for apnea and stridor.    Cardiovascular: Negative for chest pain,  palpitations and leg swelling.   Gastrointestinal: Positive for nausea. Negative for vomiting, abdominal pain, diarrhea, blood in stool and abdominal distention.   Genitourinary: Negative for dysuria, hematuria, flank pain and difficulty urinating.   Musculoskeletal: Negative for myalgias, back pain, joint swelling, arthralgias and neck pain.   Skin: Negative for color change, rash and wound.   Neurological: Negative for facial asymmetry, speech difficulty, numbness and headaches.   Hematological: Negative for adenopathy.   Psychiatric/Behavioral: Negative for suicidal ideas, hallucinations, behavioral problems, self-injury and agitation.   All other systems reviewed and are negative.      Filed Vitals:    07/22/13 0456   BP: 128/76   Pulse: 72   Temp: 98.3 ??F (36.8 ??C)   Resp: 18   Height: '5\' 2"'  (1.575 m)   Weight: 56.7 kg (125 lb)   SpO2: 98%            Physical Exam   Constitutional: She is oriented to person, place, and time. She appears well-developed and well-nourished. No distress.   HENT:   Head: Normocephalic and atraumatic.   Right Ear: External ear normal.  Left Ear: External ear normal.   Nose: Nose normal.   Mouth/Throat: Oropharynx is clear and moist. No oropharyngeal exudate.   Eyes: Conjunctivae and EOM are normal. Pupils are equal, round, and reactive to light. Right eye exhibits no discharge. Left eye exhibits no discharge.   Neck: Neck supple. No JVD present. No tracheal deviation present. No thyromegaly present.   Cardiovascular: Normal rate, regular rhythm, normal heart sounds and intact distal pulses.  Exam reveals no gallop and no friction rub.    No murmur heard.  Pulmonary/Chest: Effort normal and breath sounds normal. No respiratory distress. She has no wheezes. She has no rales. She exhibits no tenderness.   Abdominal: Soft. Bowel sounds are normal. She exhibits no distension. There is no tenderness. There is no rebound and no guarding.   Abdomen is gravida.   Musculoskeletal: Normal  range of motion. She exhibits no edema or tenderness.   Neurological: She is alert and oriented to person, place, and time. She has normal strength. She displays normal reflexes. No cranial nerve deficit or sensory deficit. Coordination normal. GCS eye subscore is 4. GCS verbal subscore is 5. GCS motor subscore is 6.   Skin: Skin is warm and dry. No rash noted. She is not diaphoretic. No erythema. No pallor.   Psychiatric: Judgment and thought content normal. Her speech is delayed. She is slowed and withdrawn. Cognition and memory are normal. She exhibits a depressed mood. She expresses no homicidal and no suicidal ideation. She expresses no suicidal plans and no homicidal plans.   Nursing note and vitals reviewed.       MDM  Number of Diagnoses or Management Options  Medication reaction, initial encounter:   Urinary tract infection without hematuria, site unspecified:      Amount and/or Complexity of Data Reviewed  Clinical lab tests: ordered and reviewed    Risk of Complications, Morbidity, and/or Mortality  Presenting problems: low  Diagnostic procedures: low  Management options: low    Patient Progress  Patient progress: stable      Procedures  Chief Complaint   Patient presents with   ??? Medication Problem       6:22 AM  The patients presenting problems have been discussed, and they are in agreement with the care plan formulated and outlined with them.  I have encouraged them to ask questions as they arise throughout their visit.    MEDICATIONS GIVEN:  Medications   sodium chloride 0.9 % bolus infusion 1,000 mL (1,000 mL IntraVENous New Bag 07/22/13 0533)   sodium chloride (NS) flush 5-10 mL (not administered)   sodium chloride (NS) flush 5-10 mL (not administered)       LABS REVIEWED:  Recent Results (from the past 24 hour(s))   URINALYSIS W/ REFLEX CULTURE    Collection Time: 07/22/13  5:14 AM   Result Value Ref Range    Color YELLOW/STRAW      Appearance CLEAR CLEAR      Specific gravity 1.007 1.003 - 1.030       pH (UA) 7.0 5.0 - 8.0      Protein NEGATIVE  NEG mg/dL    Glucose NEGATIVE  NEG mg/dL    Ketone NEGATIVE  NEG mg/dL    Bilirubin NEGATIVE  NEG      Blood NEGATIVE  NEG      Urobilinogen 0.2 0.2 - 1.0 EU/dL    Nitrites NEGATIVE  NEG      Leukocyte Esterase SMALL (A) NEG  WBC 5-10 0 - 4 /hpf    RBC 0-5 0 - 5 /hpf    Epithelial cells FEW FEW /lpf    Bacteria 2+ (A) NEG /hpf    UA:UC IF INDICATED URINE CULTURE ORDERED (A) CNI     CBC WITH AUTOMATED DIFF    Collection Time: 07/22/13  5:25 AM   Result Value Ref Range    WBC 9.2 3.6 - 11.0 K/uL    RBC 3.53 (L) 3.80 - 5.20 M/uL    HGB 11.1 (L) 11.5 - 16.0 g/dL    HCT 33.0 (L) 35.0 - 47.0 %    MCV 93.5 80.0 - 99.0 FL    MCH 31.4 26.0 - 34.0 PG    MCHC 33.6 30.0 - 36.5 g/dL    RDW 13.7 11.5 - 14.5 %    PLATELET 240 150 - 400 K/uL    NEUTROPHILS 66 32 - 75 %    LYMPHOCYTES 26 12 - 49 %    MONOCYTES 7 5 - 13 %    EOSINOPHILS 1 0 - 7 %    BASOPHILS 0 0 - 1 %    ABS. NEUTROPHILS 6.0 1.8 - 8.0 K/UL    ABS. LYMPHOCYTES 2.4 0.8 - 3.5 K/UL    ABS. MONOCYTES 0.6 0.0 - 1.0 K/UL    ABS. EOSINOPHILS 0.1 0.0 - 0.4 K/UL    ABS. BASOPHILS 0.0 0.0 - 0.1 K/UL   METABOLIC PANEL, COMPREHENSIVE    Collection Time: 07/22/13  5:25 AM   Result Value Ref Range    Sodium 137 136 - 145 mmol/L    Potassium 3.8 3.5 - 5.1 mmol/L    Chloride 105 97 - 108 mmol/L    CO2 23 21 - 32 mmol/L    Anion gap 9 5 - 15 mmol/L    Glucose 80 65 - 100 mg/dL    BUN 7 6 - 20 MG/DL    Creatinine 0.33 (L) 0.45 - 1.15 MG/DL    BUN/Creatinine ratio 21 (H) 12 - 20      GFR est AA >60 >60 ml/min/1.22m    GFR est non-AA >60 >60 ml/min/1.776m   Calcium 8.0 (L) 8.5 - 10.1 MG/DL    Bilirubin, total 0.1 (L) 0.2 - 1.0 MG/DL    ALT 14 12 - 78 U/L    AST 17 15 - 37 U/L    Alk. phosphatase 79 45 - 117 U/L    Protein, total 6.2 (L) 6.4 - 8.2 g/dL    Albumin 2.6 (L) 3.5 - 5.0 g/dL    Globulin 3.6 2.0 - 4.0 g/dL    A-G Ratio 0.7 (L) 1.1 - 2.2         RADIOLOGY RESULTS:  The following have been ordered and reviewed:  No orders to  display       PROGRESS NOTES:  Discussed results and plan with pt. Will treat uti. Will discharge home with pcp follow up.    DIAGNOSIS:    1. Medication reaction, initial encounter    2. Urinary tract infection without hematuria, site unspecified        PLAN:  1-will discharge home with OB follow up.      ED COURSE: The patients hospital course has been uncomplicated.

## 2013-07-22 NOTE — ED Notes (Signed)
Pt. States took first dose of zoloft yesterday, states has been up all night feeling sick/nausea, restless. Pt. Is [redacted] weeks pregnant. States feels tingling all over.

## 2013-07-23 LAB — CULTURE, URINE
Colonies Counted: 25000
Colony Count: 25000

## 2013-08-03 ENCOUNTER — Other Ambulatory Visit (HOSPITAL_COMMUNITY): Payer: Self-pay | Admitting: Nurse Practitioner

## 2013-08-03 DIAGNOSIS — Z3682 Encounter for antenatal screening for nuchal translucency: Secondary | ICD-10-CM

## 2013-08-03 DIAGNOSIS — Z8279 Family history of other congenital malformations, deformations and chromosomal abnormalities: Secondary | ICD-10-CM

## 2013-08-03 LAB — OB RESULTS CONSOLE ANTIBODY SCREEN: Antibody Screen: NEGATIVE

## 2013-08-03 LAB — OB RESULTS CONSOLE RPR: RPR: NONREACTIVE

## 2013-08-03 LAB — OB RESULTS CONSOLE ABO/RH: RH Type: POSITIVE

## 2013-08-03 LAB — OB RESULTS CONSOLE GC/CHLAMYDIA
Chlamydia: NEGATIVE
Gonorrhea: NEGATIVE

## 2013-08-03 LAB — OB RESULTS CONSOLE HEPATITIS B SURFACE ANTIGEN: Hepatitis B Surface Ag: NEGATIVE

## 2013-08-03 LAB — OB RESULTS CONSOLE RUBELLA ANTIBODY, IGM: Rubella: IMMUNE

## 2013-08-03 LAB — OB RESULTS CONSOLE HIV ANTIBODY (ROUTINE TESTING): HIV: NONREACTIVE

## 2013-08-11 ENCOUNTER — Encounter (HOSPITAL_COMMUNITY): Payer: Self-pay

## 2013-08-13 ENCOUNTER — Other Ambulatory Visit (HOSPITAL_COMMUNITY): Payer: Medicaid Other

## 2013-08-13 ENCOUNTER — Other Ambulatory Visit: Payer: Self-pay

## 2013-08-13 ENCOUNTER — Encounter (HOSPITAL_COMMUNITY): Payer: Self-pay

## 2013-08-13 ENCOUNTER — Ambulatory Visit (HOSPITAL_COMMUNITY)
Admission: RE | Admit: 2013-08-13 | Discharge: 2013-08-13 | Disposition: A | Discharge status: Home / Self Care | Payer: Medicaid Other | Source: Ambulatory Visit | Attending: Nurse Practitioner | Admitting: Nurse Practitioner

## 2013-08-13 ENCOUNTER — Other Ambulatory Visit (HOSPITAL_COMMUNITY): Payer: Self-pay

## 2013-08-13 DIAGNOSIS — Z8279 Family history of other congenital malformations, deformations and chromosomal abnormalities: Secondary | ICD-10-CM | POA: Diagnosis not present

## 2013-08-13 DIAGNOSIS — IMO0002 Reserved for concepts with insufficient information to code with codable children: Secondary | ICD-10-CM | POA: Insufficient documentation

## 2013-08-13 DIAGNOSIS — Z820 Family history of epilepsy and other diseases of the nervous system: Secondary | ICD-10-CM | POA: Insufficient documentation

## 2013-08-13 DIAGNOSIS — Z3682 Encounter for antenatal screening for nuchal translucency: Secondary | ICD-10-CM

## 2013-08-13 DIAGNOSIS — Z36 Encounter for antenatal screening of mother: Secondary | ICD-10-CM | POA: Insufficient documentation

## 2013-08-14 NOTE — Progress Notes (Addendum)
Genetic Counseling  High-Risk Gestation Note  Appointment Date:  08/13/2013 Referred By: Jolaine Click, NP Date of Birth:  1985-05-26 Partner: Arlis Porta   Pregnancy History: G2P0010 Estimated Date of Delivery: 02/17/14 Estimated Gestational Age: [redacted]w[redacted]d Attending: Renella Cunas, MD  I met with Mercedes Riley and her partner, Mr. Arlis Porta, for genetic counseling because of a family history of cleft lip and palate for Mr. Timmons. The patient's mother also was present for today's genetic counseling session.   We began by reviewing the family history in detail. The father of the pregnancy, Mr. Ma Rings, reported that his mother was born with unilateral cleft lip and palate, which were surgically corrected. She has high blood pressure but is otherwise healthy and has no additional physical differences from relatives. A specific cause for the cleft lip and palate is not known, and Mr. Ma Rings is unaware of any complications or exposures that occurred during the pregnancy. No additional relatives were reported with cleft lip +/- palate, including his mother's siblings and Mr. Ma Rings' siblings.   Cleft lip +/- palate occurs in approximately 1 in 1000 births.  We discussed that cleft lip +/- cleft palate is typically isolated but can also be syndromic.  If the patient's relative has a syndromic form of clefting, the chance of having an affected child depends on the inheritance pattern of that condition.  If the patient's relative has an isolated form of clefting, we discussed the probable multifactorial inheritance and explained that genetic testing for isolated cleft lip +/- cleft palate is not currently available.  Based on the reported family history of apparently isolated cleft lip and palate and assuming multifactorial inheritance, the recurrence risk for cleft lip +/- palate in the current pregnancy, a second degree relative to the affected individual, is approximately 0.6%. We discussed  the availability of targeted ultrasound in the second trimester to assess for orofacial clefts. However, the couple understands that ultrasound cannot diagnose or rule out all birth defects prenatally. Please contact our office if detailed ultrasound is desired to be performed here in the second trimester.   The patient reported that her paternal grandmother has epilepsy. She also reported to paternal first cousins with epilepsy (the cousins are also first cousins to each other). An etiology is not known for the epilepsy for any of these individuals. Epilepsy occurs in approximately 1% of the population and can have many causes.  Approximately 80% of epilepsy is thought to be idiopathic while the remaining 20% is secondary to a variety of factors such as perinatal events, infections, trauma and genetic disease.  A specific diagnosis in an affected individual is necessary to accurately assess the risk for other family members to develop epilepsy.  In the absence of a known etiology, epilepsy is thought to be caused by a combination of genetic and environmental factors, called multifactorial inheritance. Recurrence risk for epilepsy is estimated to be 4% for offspring of an individual with primary idiopathic epilepsy. Given the reported family history, recurrence risk for the current pregnancy would likely be close to the general population risk, given the degree of relation and assuming multifactorial inheritance. However, in the case of an underlying genetic etiology, recurrence risk estimate may change. The patient understands that in the absence of an identified genetic etiology, prenatal diagnosis or screening is not available for epilepsy for the majority of individuals. The family histories were otherwise found to be noncontributory for birth defects, mental retardation, and known genetic conditions.  Without further information  regarding the provided family history, an accurate genetic risk cannot be  calculated. Further genetic counseling is warranted if more information is obtained.   We reviewed available screening and diagnostic options.  Regarding screening tests, we discussed the options of First screen, Quad screen and ultrasound.  They understand that screening tests are used to modify a patient's a priori risk for aneuploidy, typically based on age.  This estimate provides a pregnancy specific risk assessment.  Ms. Runions elected to proceed with first trimester screening today. Those results will be forwarded to her OB office once available. Complete ultrasound results reported separately.   Ms. Mcmurphy was provided with written information regarding sickle cell anemia (SCA) including the carrier frequency and incidence in the African-American population, the availability of carrier testing and prenatal diagnosis if indicated.  In addition, we discussed that hemoglobinopathies are routinely screened for as part of the [place] newborn screening panel.  The patient's mother reported that she has sickle cell trait and reported that the patient, Mercedes Riley does not have sickle cell trait. We do not currently have documentation regarding this report. We discussed the option of hemoglobin electrophoresis to screen for additional hemoglobin variants, if not previously performed. Given the reported history by Ms. Parks' mother, if the patient does not have a hemoglobin variant, such as sickle cell trait, then the pregnancy would not be at increased risk for a hemoglobinopathy.  She declined hemoglobin electrophoresis today given that this was possibly being performed through her OB office and given that screening has reportedly been performed various times throughout her lifetime. Ms. Brenn may contact us if this screening has not been performed and if she would like this screening in the future.   [name] denied exposure to environmental toxins or chemical agents. She denied the use of alcohol, tobacco  or street drugs. She denied significant viral illnesses during the course of her pregnancy. Her medical and surgical histories were noncontributory.   I counseled this couple regarding the above risks and available options.  The approximate face-to-face time with the genetic counselor was 35 minutes.  Chipper Oman, MS Certified Genetic Counselor 08/14/2013

## 2013-09-03 ENCOUNTER — Other Ambulatory Visit (HOSPITAL_COMMUNITY): Payer: Self-pay | Admitting: Nurse Practitioner

## 2013-09-03 DIAGNOSIS — Z3689 Encounter for other specified antenatal screening: Secondary | ICD-10-CM

## 2013-09-24 ENCOUNTER — Other Ambulatory Visit (HOSPITAL_COMMUNITY): Payer: Self-pay | Admitting: Nurse Practitioner

## 2013-09-24 ENCOUNTER — Ambulatory Visit (HOSPITAL_COMMUNITY)
Admission: RE | Admit: 2013-09-24 | Discharge: 2013-09-24 | Disposition: A | Discharge status: Home / Self Care | Payer: Medicaid Other | Source: Ambulatory Visit | Attending: Nurse Practitioner | Admitting: Nurse Practitioner

## 2013-09-24 DIAGNOSIS — Z3689 Encounter for other specified antenatal screening: Secondary | ICD-10-CM

## 2013-09-24 DIAGNOSIS — Z1389 Encounter for screening for other disorder: Secondary | ICD-10-CM

## 2013-10-05 ENCOUNTER — Other Ambulatory Visit (HOSPITAL_COMMUNITY): Payer: Self-pay | Admitting: Nurse Practitioner

## 2013-10-05 DIAGNOSIS — Z0489 Encounter for examination and observation for other specified reasons: Secondary | ICD-10-CM

## 2013-10-05 DIAGNOSIS — IMO0002 Reserved for concepts with insufficient information to code with codable children: Secondary | ICD-10-CM

## 2013-10-26 ENCOUNTER — Ambulatory Visit (HOSPITAL_COMMUNITY)
Admission: RE | Admit: 2013-10-26 | Discharge: 2013-10-26 | Disposition: A | Discharge status: Home / Self Care | Payer: Medicaid Other | Source: Ambulatory Visit | Attending: Nurse Practitioner | Admitting: Nurse Practitioner

## 2013-10-26 DIAGNOSIS — Z363 Encounter for antenatal screening for malformations: Secondary | ICD-10-CM | POA: Insufficient documentation

## 2013-10-26 DIAGNOSIS — Z1389 Encounter for screening for other disorder: Secondary | ICD-10-CM | POA: Insufficient documentation

## 2013-10-26 DIAGNOSIS — Z0489 Encounter for examination and observation for other specified reasons: Secondary | ICD-10-CM

## 2013-10-26 DIAGNOSIS — IMO0002 Reserved for concepts with insufficient information to code with codable children: Secondary | ICD-10-CM

## 2013-12-07 ENCOUNTER — Encounter (HOSPITAL_COMMUNITY): Payer: Self-pay

## 2013-12-20 ENCOUNTER — Encounter (HOSPITAL_COMMUNITY): Payer: Self-pay | Admitting: *Deleted

## 2013-12-20 ENCOUNTER — Inpatient Hospital Stay (HOSPITAL_COMMUNITY)
Admission: AD | Admit: 2013-12-20 | Discharge: 2013-12-20 | Disposition: A | Discharge status: Home / Self Care | Payer: Medicaid Other | Source: Ambulatory Visit | Attending: Obstetrics & Gynecology | Admitting: Obstetrics & Gynecology

## 2013-12-20 DIAGNOSIS — O26893 Other specified pregnancy related conditions, third trimester: Secondary | ICD-10-CM | POA: Insufficient documentation

## 2013-12-20 DIAGNOSIS — N898 Other specified noninflammatory disorders of vagina: Secondary | ICD-10-CM | POA: Diagnosis not present

## 2013-12-20 DIAGNOSIS — Z87891 Personal history of nicotine dependence: Secondary | ICD-10-CM | POA: Diagnosis not present

## 2013-12-20 DIAGNOSIS — B373 Candidiasis of vulva and vagina: Secondary | ICD-10-CM

## 2013-12-20 DIAGNOSIS — B3731 Acute candidiasis of vulva and vagina: Secondary | ICD-10-CM

## 2013-12-20 DIAGNOSIS — O2343 Unspecified infection of urinary tract in pregnancy, third trimester: Secondary | ICD-10-CM

## 2013-12-20 LAB — URINALYSIS, ROUTINE W REFLEX MICROSCOPIC
Bilirubin Urine: NEGATIVE
Glucose, UA: NEGATIVE mg/dL
Ketones, ur: NEGATIVE mg/dL
Nitrite: NEGATIVE
PROTEIN: NEGATIVE mg/dL
SPECIFIC GRAVITY, URINE: 1.025 (ref 1.005–1.030)
UROBILINOGEN UA: 0.2 mg/dL (ref 0.0–1.0)
pH: 6 (ref 5.0–8.0)

## 2013-12-20 LAB — URINE MICROSCOPIC-ADD ON

## 2013-12-20 LAB — WET PREP, GENITAL
CLUE CELLS WET PREP: NONE SEEN
Trich, Wet Prep: NONE SEEN

## 2013-12-20 MED ORDER — TERCONAZOLE 0.4 % VA CREA: 1.0000 | TOPICAL_CREAM | Freq: Every day | VAGINAL | Status: DC

## 2013-12-20 MED ORDER — NITROFURANTOIN MONOHYD MACRO 100 MG PO CAPS: 100.0000 mg | ORAL_CAPSULE | Freq: Two times a day (BID) | ORAL | Status: DC

## 2013-12-20 NOTE — MAU Note (Signed)
Increase in vaginal discharge tonight; saturated a panty liner & went through her pants. Denies vaginal bleeding/abdominal pain/contractions. Positive fetal movement.

## 2013-12-20 NOTE — MAU Provider Note (Signed)
Chief Complaint:  Vaginal Discharge  First Provider Initiated Contact with Patient 12/20/13 2222    HPI: Mercedes Riley is a 28 y.o. G2P0010 at 4344w4d who presents to maternity admissions reporting one episode of leaking clear fluid around 6:30 pm. Soaked panty liner and leaked small amount on clothes. Also reports thick white, discharge, vaginal irritation and urinary urgency x a few weeks  Denies fever, chills, dysuria, frequency, flank pain, contractions, vaginal odor or vaginal bleeding. Good fetal movement.   Pregnancy Course: Uncomplicated.   Past Medical History: Past Medical History  Diagnosis Date  . Chlamydia   . Medical history non-contributory     Past obstetric history: OB History  Gravida Para Term Preterm AB SAB TAB Ectopic Multiple Living  2 0   1 1    0    # Outcome Date GA Lbr Len/2nd Weight Sex Delivery Anes PTL Lv  2 Current           1 SAB 2001 5241w0d             Past Surgical History: Past Surgical History  Procedure Laterality Date  . Dilation and curettage of uterus       Family History: Family History  Problem Relation Age of Onset  . Diabetes Mother   . Asthma Brother   . Seizures Paternal Grandmother     Social History: History  Substance Use Topics  . Smoking status: Former Games developermoker  . Smokeless tobacco: Not on file  . Alcohol Use: No    Allergies: No Known Allergies  Meds:  Prescriptions prior to admission  Medication Sig Dispense Refill Last Dose  . Prenatal Vit-Fe Fumarate-FA (PRENATAL MULTIVITAMIN) TABS tablet Take 1 tablet by mouth daily at 12 noon.   12/20/2013 at Unknown time  . amoxicillin (AMOXIL) 500 MG capsule Take 1 capsule (500 mg total) by mouth 3 (three) times daily. (Patient not taking: Reported on 12/20/2013) 21 capsule 0 More than a month at Unknown time  . HYDROcodone-acetaminophen (NORCO/VICODIN) 5-325 MG per tablet Take 1 tablet by mouth every 4 (four) hours as needed. (Patient not taking: Reported on 12/20/2013)  13 tablet 0 Not Taking  . metroNIDAZOLE (FLAGYL) 250 MG tablet Take 250 mg by mouth 3 (three) times daily.     . norgestimate-ethinyl estradiol (ORTHO-CYCLEN,SPRINTEC,PREVIFEM) 0.25-35 MG-MCG tablet Take 2 tabs twice daily until bleeding stops then take 1 tab once daily when bleeding lasts longer than usual.   Not Taking    ROS: Pertinent pos findings in history of present illness. Denies fever, chills, dysuria, frequency, flank pain, contractions, vaginal odor or vaginal bleeding.   Physical Exam  Blood pressure 135/79, pulse 111, temperature 98.8 F (37.1 C), temperature source Oral, resp. rate 18, height 5\' 8"  (1.727 m), weight 111.131 kg (245 lb), last menstrual period 04/19/2013, SpO2 100 %. GENERAL: Well-developed, well-nourished female in no acute distress.  HEENT: normocephalic HEART: normal rate RESP: normal effort ABDOMEN: Soft, non-tender, gravid appropriate for gestational age EXTREMITIES: Nontender, no edema NEURO: alert and oriented SPECULUM EXAM: NEFG, large amount of thick, white, odorless curd-like discharge mixed w/ thin white discharge. Neg pooling. No blood, cervix clean. Vaginal walls erythematous.   Cervix long/closed/-3, posterior.   FHT:  Baseline 140 , moderate variability, 10x10 accelerations present, few mild variable decelerations Contractions: irreg, painless w/ UI   Labs: Results for orders placed or performed during the hospital encounter of 12/20/13 (from the past 24 hour(s))  Urinalysis, Routine w reflex microscopic     Status:  Abnormal   Collection Time: 12/20/13  7:51 PM  Result Value Ref Range   Color, Urine YELLOW YELLOW   APPearance CLEAR CLEAR   Specific Gravity, Urine 1.025 1.005 - 1.030   pH 6.0 5.0 - 8.0   Glucose, UA NEGATIVE NEGATIVE mg/dL   Hgb urine dipstick MODERATE (A) NEGATIVE   Bilirubin Urine NEGATIVE NEGATIVE   Ketones, ur NEGATIVE NEGATIVE mg/dL   Protein, ur NEGATIVE NEGATIVE mg/dL   Urobilinogen, UA 0.2 0.0 - 1.0 mg/dL    Nitrite NEGATIVE NEGATIVE   Leukocytes, UA MODERATE (A) NEGATIVE  Urine microscopic-add on     Status: Abnormal   Collection Time: 12/20/13  7:51 PM  Result Value Ref Range   Squamous Epithelial / LPF FEW (A) RARE   WBC, UA 21-50 <3 WBC/hpf   RBC / HPF 21-50 <3 RBC/hpf   Bacteria, UA MANY (A) RARE   Urine-Other FEW YEAST   Wet prep, genital     Status: Abnormal   Collection Time: 12/20/13  8:45 PM  Result Value Ref Range   Yeast Wet Prep HPF POC MODERATE (A) NONE SEEN   Trich, Wet Prep NONE SEEN NONE SEEN   Clue Cells Wet Prep HPF POC NONE SEEN NONE SEEN   WBC, Wet Prep HPF POC MODERATE (A) NONE SEEN    Imaging:  No results found. MAU Course:   Assessment: No diagnosis found.  Plan: Discharge home in stable condition. Preterm labor precautions and fetal kick counts Urine culture pending. Push fluids.  Follow-up Information    Follow [facility] with [facility] [place] On 12/24/2013.   Contact information:   [address] [place] [id number] [phone]      [facility].   Why:  As needed in emergencies   Contact information:   [address] [id number] mc [place] [id number] [phone]       Medication List    STOP taking these medications        amoxicillin 500 MG capsule  Commonly known as:  AMOXIL     HYDROcodone-acetaminophen 5-325 MG per tablet  Commonly known as:  NORCO/VICODIN     metroNIDAZOLE 250 MG tablet  Commonly known as:  FLAGYL     norgestimate-ethinyl estradiol 0.25-35 MG-MCG tablet  Commonly known as:  ORTHO-CYCLEN,SPRINTEC,PREVIFEM      TAKE these medications        nitrofurantoin (macrocrystal-monohydrate) 100 MG capsule  Commonly known as:  MACROBID  Take 1 capsule (100 mg total) by mouth 2 (two) times daily.     prenatal multivitamin Tabs tablet  Take 1 tablet by mouth daily at 12 noon.     terconazole 0.4 % vaginal cream   Commonly known as:  TERAZOL 7  Place 1 applicator vaginally at bedtime. x 7 nights        BuellVirginia Emori Mumme, [place] 12/20/2013 9:24 PM

## 2013-12-20 NOTE — Discharge Instructions (Signed)
Pregnancy and Urinary Tract Infection A urinary tract infection (UTI) is a bacterial infection of the urinary tract. Infection of the urinary tract can include the ureters, kidneys (pyelonephritis), bladder (cystitis), and urethra (urethritis). All pregnant women should be screened for bacteria in the urinary tract. Identifying and treating a UTI will decrease the risk of preterm labor and developing more serious infections in both the mother and baby. CAUSES Bacteria germs cause almost all UTIs.  RISK FACTORS Many factors can increase your chances of getting a UTI during pregnancy. These include:  Having a short urethra.  Poor toilet and hygiene habits.  Sexual intercourse.  Blockage of urine along the urinary tract.  Monilial Vaginitis Vaginitis in a soreness, swelling and redness (inflammation) of the vagina and vulva. Monilial vaginitis is not a sexually transmitted infection. CAUSES  Yeast vaginitis is caused by yeast (candida) that is normally found in your vagina. With a yeast infection, the candida has overgrown in number to a point that upsets the chemical balance. SYMPTOMS   White, thick vaginal discharge.  Swelling, itching, redness and irritation of the vagina and possibly the lips of the vagina (vulva).  Burning or painful urination.  Painful intercourse. DIAGNOSIS  Things that may contribute to monilial vaginitis are:  Postmenopausal and virginal states.  Pregnancy.  Infections.  Being tired, sick or stressed, especially if you had monilial vaginitis in the past.  Diabetes. Good control will help lower the chance.  Birth control pills.  Tight fitting garments.  Using bubble bath, feminine sprays, douches or deodorant tampons.  Taking certain medications that kill germs (antibiotics).  Sporadic recurrence can occur if you become ill. TREATMENT  Your caregiver will give you medication.  There are several kinds of anti monilial vaginal creams and  suppositories specific for monilial vaginitis. For recurrent yeast infections, use a suppository or cream in the vagina 2 times a week, or as directed.  Anti-monilial or steroid cream for the itching or irritation of the vulva may also be used. Get your caregiver's permission.  Painting the vagina with methylene blue solution may help if the monilial cream does not work.  Eating yogurt may help prevent monilial vaginitis. HOME CARE INSTRUCTIONS   Finish all medication as prescribed.  Do not have sex until treatment is completed or after your caregiver tells you it is okay.  Take warm sitz baths.  Do not douche.  Do not use tampons, especially scented ones.  Wear cotton underwear.  Avoid tight pants and panty hose.  Tell your sexual partner that you have a yeast infection. They should go to their caregiver if they have symptoms such as mild rash or itching.  Your sexual partner should be treated as well if your infection is difficult to eliminate.  Practice safer sex. Use condoms.  Some vaginal medications cause latex condoms to fail. Vaginal medications that harm condoms are:  Cleocin cream.  Butoconazole (Femstat).  Terconazole (Terazol) vaginal suppository.  Miconazole (Monistat) (may be purchased over the counter). SEEK MEDICAL CARE IF:   You have a temperature by mouth above 102 F (38.9 C).  The infection is getting worse after 2 days of treatment.  The infection is not getting better after 3 days of treatment.  You develop blisters in or around your vagina.  You develop vaginal bleeding, and it is not your menstrual period.  You have pain when you urinate.  You develop intestinal problems.  You have pain with sexual intercourse. Document Released: 11/01/2004 Document Revised: 04/16/2011 Document  Reviewed: 07/16/2008 ExitCare Patient Information 2015 [place], [place]. This information is not intended to replace advice given to you by your health care  provider. Make sure you discuss any questions you have with your health care provider.   Problems with the pelvic muscles or nerves.  Diabetes.  Obesity.  Bladder problems after having several children.  Previous history of UTI. SIGNS AND SYMPTOMS   Pain, burning, or a stinging feeling when urinating.  Suddenly feeling the need to urinate right away (urgency).  Loss of bladder control (urinary incontinence).  Frequent urination, more than is common with pregnancy.  Lower abdominal or back discomfort.  Cloudy urine.  Blood in the urine (hematuria).  Fever. When the kidneys are infected, the symptoms may be:  Back pain.  Flank pain on the right side more so than the left.  Fever.  Chills.  Nausea.  Vomiting. DIAGNOSIS  A urinary tract infection is usually diagnosed through urine tests. Additional tests and procedures are sometimes done. These may include:  Ultrasound exam of the kidneys, ureters, bladder, and urethra.  Looking in the bladder with a lighted tube (cystoscopy). TREATMENT Typically, UTIs can be treated with antibiotic medicines.  HOME CARE INSTRUCTIONS   Only take over-the-counter or prescription medicines as directed by your health care provider. If you were prescribed antibiotics, take them as directed. Finish them even if you start to feel better.  Drink enough fluids to keep your urine clear or pale yellow.  Do not have sexual intercourse until the infection is gone and your health care provider says it is okay.  Make sure you are tested for UTIs throughout your pregnancy. These infections often come back. Preventing a UTI in the Future  Practice good toilet habits. Always wipe from front to back. Use the tissue only once.  Do not hold your urine. Empty your bladder as soon as possible when the urge comes.  Do not douche or use deodorant sprays.  Wash with soap and warm water around the genital area and the anus.  Empty your  bladder before and after sexual intercourse.  Wear underwear with a cotton crotch.  Avoid caffeine and carbonated drinks. They can irritate the bladder.  Drink cranberry juice or take cranberry pills. This may decrease the risk of getting a UTI.  Do not drink alcohol.  Keep all your appointments and tests as scheduled. SEEK MEDICAL CARE IF:   Your symptoms get worse.  You are still having fevers 2 or more days after treatment begins.  You have a rash.  You feel that you are having problems with medicines prescribed.  You have abnormal vaginal discharge. SEEK IMMEDIATE MEDICAL CARE IF:   You have back or flank pain.  You have chills.  You have blood in your urine.  You have nausea and vomiting.  You have contractions of your uterus.  You have a gush of fluid from the vagina. MAKE SURE YOU:  Understand these instructions.   Will watch your condition.   Will get help right away if you are not doing well or get worse.  Document Released: 05/19/2010 Document Revised: 11/12/2012 Document Reviewed: 08/21/2012 [facility] Patient Information 2015 [place], [place]. This information is not intended to replace advice given to you by your health care provider. Make sure you discuss any questions you have with your health care provider.

## 2013-12-20 NOTE — Treatment Plan (Signed)
Dr Loreta Aveacosta notified of patients complaints.  Orders to do blind wet prep and fern. Will come to see patient in a minute

## 2013-12-22 LAB — CULTURE, OB URINE
Colony Count: 50000
Special Requests: NORMAL

## 2014-01-21 ENCOUNTER — Emergency Department (HOSPITAL_COMMUNITY)
Admission: EM | Admit: 2014-01-21 | Discharge: 2014-01-22 | Disposition: A | Discharge status: Home / Self Care | Payer: Medicaid Other | Attending: Emergency Medicine | Admitting: Emergency Medicine

## 2014-01-21 ENCOUNTER — Encounter (HOSPITAL_COMMUNITY): Payer: Self-pay | Admitting: Emergency Medicine

## 2014-01-21 DIAGNOSIS — Z87891 Personal history of nicotine dependence: Secondary | ICD-10-CM | POA: Diagnosis not present

## 2014-01-21 DIAGNOSIS — Z79899 Other long term (current) drug therapy: Secondary | ICD-10-CM | POA: Diagnosis not present

## 2014-01-21 DIAGNOSIS — Z3A36 36 weeks gestation of pregnancy: Secondary | ICD-10-CM | POA: Insufficient documentation

## 2014-01-21 DIAGNOSIS — O9989 Other specified diseases and conditions complicating pregnancy, childbirth and the puerperium: Secondary | ICD-10-CM | POA: Diagnosis present

## 2014-01-21 DIAGNOSIS — R05 Cough: Secondary | ICD-10-CM | POA: Diagnosis not present

## 2014-01-21 DIAGNOSIS — Z8619 Personal history of other infectious and parasitic diseases: Secondary | ICD-10-CM | POA: Insufficient documentation

## 2014-01-21 DIAGNOSIS — R059 Cough, unspecified: Secondary | ICD-10-CM

## 2014-01-21 NOTE — ED Notes (Signed)
Pt states she has had cold sxs for about a week and for the past few days she has had a deep cough  Pt states she is coughing up phlegm but when she coughs it hurts her chest and abdomen  Pt states she is [redacted] weeks pregnant  Pt denies any problems with her pregnancy

## 2014-01-22 ENCOUNTER — Emergency Department (HOSPITAL_COMMUNITY): Payer: Medicaid Other

## 2014-01-22 NOTE — ED Provider Notes (Signed)
CSN: [id number]     Arrival date & time 01/21/14  2244 History   First MD Initiated Contact with Patient 01/21/14 2341     Chief Complaint  Patient presents with  . URI  . Cough     (Consider location/radiation/quality/duration/timing/severity/associated sxs/prior Treatment) HPI  Mercedes Riley is a 28 y.o. female G1 P0 at 36 weeks coming in with cough. Patient states she's had viral URI like symptoms for the past week, cough for the last 3 or 4 days. She presents because it is keeping her up at night. The cough is productive of yellowish-green sputum. She denies fevers or diaphoresis. She has midsternal chest pain that is worse with the cough. She also has some shortness of breath consistent with her pregnancy. She feels that she caught this illness from her mother with similar symptoms. She has taken Robitussin at home for relief, but this has not worked. Patient denies any further complaints, she states she still feels the fetus moving, there is no abnormal vaginal bleeding, there is no large leakage of fluid.    10 Systems reviewed and are negative for acute change except as noted in the HPI.    Past Medical History  Diagnosis Date  . Chlamydia   . Medical history non-contributory    Past Surgical History  Procedure Laterality Date  . Dilation and curettage of uterus     Family History  Problem Relation Age of Onset  . Diabetes Mother    History  Substance Use Topics  . Smoking status: Former Games developermoker  . Smokeless tobacco: Not on file  . Alcohol Use: No   OB History    Gravida Para Term Preterm AB TAB SAB Ectopic Multiple Living   2 0   1  1   0     Review of Systems    Allergies  Review of patient's allergies indicates no known allergies.  Home Medications   Prior to Admission medications   Medication Sig Start Date End Date Taking? Authorizing Provider  guaiFENesin-dextromethorphan (ROBITUSSIN DM) 100-10 MG/5ML syrup Take 10 mLs by mouth every 4 (four) hours  as needed for cough.   Yes Historical Provider, MD  Prenatal Vit-Fe Fumarate-FA (PRENATAL MULTIVITAMIN) TABS tablet Take 1 tablet by mouth daily at 12 noon.   Yes Historical Provider, MD  nitrofurantoin, macrocrystal-monohydrate, (MACROBID) 100 MG capsule Take 1 capsule (100 mg total) by mouth 2 (two) times daily. Patient not taking: Reported on 01/21/2014 12/20/13   Dorathy KinsmanVirginia Smith, CNM  terconazole (TERAZOL 7) 0.4 % vaginal cream Place 1 applicator vaginally at bedtime. x 7 nights Patient not taking: Reported on 01/21/2014 12/20/13   Dorathy KinsmanVirginia Smith, CNM   BP 127/69 mmHg  Pulse 94  Temp(Src) 97.9 F (36.6 C) (Oral)  Resp 16  SpO2 100%  LMP 04/19/2013 Physical Exam  Constitutional: She is oriented to person, place, and time. She appears well-developed and well-nourished. No distress.  HENT:  Head: Normocephalic and atraumatic.  Nose: Nose normal.  Mouth/Throat: Oropharynx is clear and moist. No oropharyngeal exudate.  Eyes: Conjunctivae and EOM are normal. Pupils are equal, round, and reactive to light. No scleral icterus.  Neck: Normal range of motion. Neck supple. No JVD present. No tracheal deviation present. No thyromegaly present.  Cardiovascular: Normal rate, regular rhythm and normal heart sounds.  Exam reveals no gallop and no friction rub.   No murmur heard. Pulmonary/Chest: Effort normal and breath sounds normal. No respiratory distress. She has no wheezes. She exhibits no tenderness.  Abdominal: Soft. Bowel sounds are normal. She exhibits no distension and no mass. There is no tenderness. There is no rebound and no guarding.  Gravid uterus  Musculoskeletal: Normal range of motion. She exhibits no edema or tenderness.  Lymphadenopathy:    She has no cervical adenopathy.  Neurological: She is alert and oriented to person, place, and time. No cranial nerve deficit. She exhibits normal muscle tone.  Skin: Skin is warm and dry. No rash noted. She is not diaphoretic. No erythema.  No pallor.  Nursing note and vitals reviewed.   ED Course  Procedures (including critical care time) Labs Review Labs Reviewed - No data to display  Imaging Review Dg Chest 2 View  01/22/2014   CLINICAL DATA:  Cough.  EXAM: CHEST  2 VIEW  COMPARISON:  None.  FINDINGS: The heart size and mediastinal contours are within normal limits. Both lungs are clear. No pneumothorax or pleural effusion is noted. The visualized skeletal structures are unremarkable.  IMPRESSION: No acute cardiopulmonary abnormality seen.   Electronically Signed   By: Roque LiasJames  Green M.D.   On: 01/22/2014 01:57     EKG Interpretation None      MDM   Final diagnoses:  Cough    Patient since emergency department out of concern for cough. Will evaluate with chest x-ray to ensure there is no pneumonia. My suspicion is low. Patient is also advised there is no other safe medication for cough during pregnancy. Can continue to take Robitussin as needed until this passes.  Chest x-ray is negative for pneumonia, patient symptoms has improved without any intervention the emergency department. Her vital signs were within her normal limits and she is safe for discharge.   EMERGENCY DEPARTMENT US PREGNANCY "Study: Limited Ultrasound of the Pelvis"  INDICATIONS:Pregnancy(required) Multiple views of the uterus and pelvic cavity are obtained with a multi-frequency probe.  APPROACH:Transabdominal   PERFORMED BY: Myself  IMAGES ARCHIVED?: Yes  LIMITATIONS: Decompressed bladder   PREGNANCY FREE FLUID: None  PREGNANCY UTERUS FINDINGS:Uterus not visualized ADNEXAL FINDINGS:Left ovary not seen  PREGNANCY FINDINGS: Fetal heart activity seen  INTERPRETATION: Viable intrauterine pregnancy  GESTATIONAL AGE, ESTIMATE: 36 weeks per history  FETAL HEART RATE: Did not obtain      Tomasita CrumbleAdeleke Laurine Kuyper, MD 01/22/14 0206

## 2014-01-22 NOTE — Discharge Instructions (Signed)
Cough, Adult Mercedes Riley, you were seen today for a cough. Your x-rays negative for pneumonia. Follow-up with your OB/GYN physician within 3 days for continued care.  If symptoms worsen, come back to the ED immediately.  Thank you.  A cough is a reflex. It helps you clear your throat and airways. A cough can help heal your body. A cough can last 2 or 3 weeks (acute) or may last more than 8 weeks (chronic). Some common causes of a cough can include an infection, allergy, or a cold. HOME CARE  Only take medicine as told by your doctor.  If given, take your medicines (antibiotics) as told. Finish them even if you start to feel better.  Use a cold steam vaporizer or humidifier in your home. This can help loosen thick spit (secretions).  Sleep so you are almost sitting up (semi-upright). Use pillows to do this. This helps reduce coughing.  Rest as needed.  Stop smoking if you smoke. GET HELP RIGHT AWAY IF:  You have yellowish-white fluid (pus) in your thick spit.  Your cough gets worse.  Your medicine does not reduce coughing, and you are losing sleep.  You cough up blood.  You have trouble breathing.  Your pain gets worse and medicine does not help.  You have a fever. MAKE SURE YOU:   Understand these instructions.  Will watch your condition.  Will get help right away if you are not doing well or get worse. Document Released: 10/05/2010 Document Revised: 06/08/2013 Document Reviewed: 10/05/2010 [facility] Patient Information 2015 [place], [place]. This information is not intended to replace advice given to you by your health care provider. Make sure you discuss any questions you have with your health care provider.

## 2014-01-26 LAB — OB RESULTS CONSOLE GBS: GBS: NEGATIVE

## 2014-02-05 NOTE — L&D Delivery Note (Signed)
Delivery Note At 7:40 PM a viable female was delivered via Vaginal, Spontaneous Delivery (Presentation: Right Occiput Anterior).  APGAR: 7, 9; weight 9 lb 3.6 oz (4185 g).   Placenta status: Intact, Spontaneous.  Cord: 3 vessels with the following complications: None.    Anesthesia: Epidural  Episiotomy: None Lacerations: 3rd degree Suture Repair: 3.0 vicryl Est. Blood Loss (mL): initial EBL around 200 ml, after placenta delivered total approximately 500 ml  Mom to postpartum.  Baby to Couplet care / Skin to Skin.  Rolm BookbinderMoss, Pollyann Roa 02/24/2014, 8:32 PM

## 2014-02-18 ENCOUNTER — Other Ambulatory Visit (HOSPITAL_COMMUNITY): Payer: Self-pay | Admitting: Urology

## 2014-02-18 DIAGNOSIS — O480 Post-term pregnancy: Secondary | ICD-10-CM

## 2014-02-22 ENCOUNTER — Telehealth (HOSPITAL_COMMUNITY): Payer: Self-pay | Admitting: *Deleted

## 2014-02-22 ENCOUNTER — Ambulatory Visit (HOSPITAL_COMMUNITY)
Admission: RE | Admit: 2014-02-22 | Discharge: 2014-02-22 | Disposition: A | Discharge status: Home / Self Care | Payer: Medicaid Other | Source: Ambulatory Visit | Attending: Urology | Admitting: Urology

## 2014-02-22 DIAGNOSIS — Z3A40 40 weeks gestation of pregnancy: Secondary | ICD-10-CM | POA: Insufficient documentation

## 2014-02-22 DIAGNOSIS — Z36 Encounter for antenatal screening of mother: Secondary | ICD-10-CM

## 2014-02-22 DIAGNOSIS — O480 Post-term pregnancy: Secondary | ICD-10-CM

## 2014-02-22 NOTE — Telephone Encounter (Signed)
Preadmission screen  

## 2014-02-23 ENCOUNTER — Encounter (HOSPITAL_COMMUNITY): Payer: Self-pay | Admitting: *Deleted

## 2014-02-23 ENCOUNTER — Inpatient Hospital Stay (HOSPITAL_COMMUNITY)
Admission: AD | Admit: 2014-02-23 | Discharge: 2014-02-26 | DRG: 989 | Disposition: A | Discharge status: Home / Self Care | Payer: Medicaid Other | Source: Ambulatory Visit | Attending: Obstetrics & Gynecology | Admitting: Obstetrics & Gynecology

## 2014-02-23 DIAGNOSIS — Z3A40 40 weeks gestation of pregnancy: Secondary | ICD-10-CM | POA: Diagnosis present

## 2014-02-23 DIAGNOSIS — Z3490 Encounter for supervision of normal pregnancy, unspecified, unspecified trimester: Secondary | ICD-10-CM

## 2014-02-23 LAB — CBC
HCT: 31.9 % — ABNORMAL LOW (ref 36.0–46.0)
HEMOGLOBIN: 10.2 g/dL — AB (ref 12.0–15.0)
MCH: 28 pg (ref 26.0–34.0)
MCHC: 32 g/dL (ref 30.0–36.0)
MCV: 87.6 fL (ref 78.0–100.0)
PLATELETS: 190 10*3/uL (ref 150–400)
RBC: 3.64 MIL/uL — ABNORMAL LOW (ref 3.87–5.11)
RDW: 14.6 % (ref 11.5–15.5)
WBC: 8 10*3/uL (ref 4.0–10.5)

## 2014-02-23 LAB — TYPE AND SCREEN
ABO/RH(D): O POS
Antibody Screen: NEGATIVE

## 2014-02-23 MED ORDER — OXYCODONE-ACETAMINOPHEN 5-325 MG PO TABS
2.0000 | ORAL_TABLET | ORAL | Status: DC | PRN
  Administered 2014-02-24: 2 via ORAL
  Filled 2014-02-23: qty 2

## 2014-02-23 MED ORDER — OXYTOCIN BOLUS FROM INFUSION
500.0000 mL | INTRAVENOUS | Status: DC
  Administered 2014-02-24: 500 mL via INTRAVENOUS

## 2014-02-23 MED ORDER — TERBUTALINE SULFATE 1 MG/ML IJ SOLN: 0.2500 mg | Freq: Once | INTRAMUSCULAR | Status: AC | PRN

## 2014-02-23 MED ORDER — FENTANYL CITRATE 0.05 MG/ML IJ SOLN
100.0000 ug | INTRAMUSCULAR | Status: DC | PRN
  Administered 2014-02-23 – 2014-02-24 (×4): 100 ug via INTRAVENOUS
  Filled 2014-02-23 (×4): qty 2

## 2014-02-23 MED ORDER — CITRIC ACID-SODIUM CITRATE 334-500 MG/5ML PO SOLN: 30.0000 mL | ORAL | Status: DC | PRN

## 2014-02-23 MED ORDER — LACTATED RINGERS IV SOLN
INTRAVENOUS | Status: DC
  Administered 2014-02-23 – 2014-02-24 (×3): via INTRAVENOUS

## 2014-02-23 MED ORDER — OXYCODONE-ACETAMINOPHEN 5-325 MG PO TABS: 1.0000 | ORAL_TABLET | ORAL | Status: DC | PRN

## 2014-02-23 MED ORDER — FLEET ENEMA 7-19 GM/118ML RE ENEM: 1.0000 | ENEMA | RECTAL | Status: DC | PRN

## 2014-02-23 MED ORDER — ACETAMINOPHEN 325 MG PO TABS: 650.0000 mg | ORAL_TABLET | ORAL | Status: DC | PRN

## 2014-02-23 MED ORDER — OXYTOCIN 40 UNITS IN LACTATED RINGERS INFUSION - SIMPLE MED
1.0000 m[IU]/min | INTRAVENOUS | Status: DC
  Administered 2014-02-23: 2 m[IU]/min via INTRAVENOUS
  Administered 2014-02-24: 4 m[IU]/min via INTRAVENOUS
  Filled 2014-02-23: qty 1000

## 2014-02-23 MED ORDER — ONDANSETRON HCL 4 MG/2ML IJ SOLN
4.0000 mg | Freq: Four times a day (QID) | INTRAMUSCULAR | Status: DC | PRN
  Administered 2014-02-24: 4 mg via INTRAVENOUS
  Filled 2014-02-23: qty 2

## 2014-02-23 MED ORDER — LACTATED RINGERS IV SOLN: 500.0000 mL | INTRAVENOUS | Status: DC | PRN

## 2014-02-23 MED ORDER — OXYTOCIN 40 UNITS IN LACTATED RINGERS INFUSION - SIMPLE MED
62.5000 mL/h | INTRAVENOUS | Status: DC
  Administered 2014-02-24: 62.5 mL/h via INTRAVENOUS

## 2014-02-23 MED ORDER — LIDOCAINE HCL (PF) 1 % IJ SOLN
30.0000 mL | INTRAMUSCULAR | Status: DC | PRN
  Filled 2014-02-23: qty 30

## 2014-02-23 NOTE — Progress Notes (Signed)
Patient ID: Mercedes Riley, female   DOB: 11-28-1985, 29 y.o.   MRN: [id number] Mercedes Riley is a 29 y.o. G2P0010 at 6743w6d.  Subjective: Cramping.  Taking Fentanyl.   Objective: BP 118/72 mmHg  Pulse 93  Temp(Src) 98.2 F (36.8 C) (Oral)  Resp 16  Ht 5' 8.5" (1.74 m)  Wt 111.131 kg (245 lb)  BMI 36.71 kg/m2  LMP 04/19/2013   FHT:  FHR: 103 bpm, variability: min-mod,  accelerations:  15x15,  decelerations:  none UC:   Q 2-4 minutes, mild  Dilation: 1.5 Effacement (%): 50 Cervical Position: Middle Station: -2 Presentation: Vertex Exam by:: [employer]  Labs: Results for orders placed or performed during the hospital encounter of 02/23/14 (from the past 24 hour(s))  CBC     Status: Abnormal   Collection Time: 02/23/14 12:35 PM  Result Value Ref Range   WBC 8.0 4.0 - 10.5 K/uL   RBC 3.64 (L) 3.87 - 5.11 MIL/uL   Hemoglobin 10.2 (L) 12.0 - 15.0 g/dL   HCT 40.931.9 (L) 81.136.0 - 91.446.0 %   MCV 87.6 78.0 - 100.0 fL   MCH 28.0 26.0 - 34.0 pg   MCHC 32.0 30.0 - 36.0 g/dL   RDW 78.214.6 95.611.5 - 21.315.5 %   Platelets 190 150 - 400 K/uL  Type and screen     Status: None   Collection Time: 02/23/14 12:35 PM  Result Value Ref Range   ABO/RH(D) O POS    Antibody Screen NEG    Sample Expiration 02/26/2014     Assessment / Plan: 2743w6d week IUP Labor: IOL Fetal Wellbeing:  Category I-II Pain Control:  Fentanyl Anticipated MOD:  NSVD  Mercedes KinsmanVirginia Mikah Riley, CNM 02/23/2014 9:11 PM

## 2014-02-23 NOTE — Plan of Care (Signed)
Problem: Consults Goal: Birthing Suites Patient Information Press F2 to bring up selections list   Pt > [redacted] weeks EGA and Fetal indication (non reassuring FHR testing in office)

## 2014-02-23 NOTE — H&P (Signed)
LABOR ADMISSION HISTORY AND PHYSICAL  Mercedes Riley is a 29 y.o. female G2P0010 with IUP at 3954w6d by dating 217w1d sono presenting for IOL 2/2 NRNST earlier today at HD. She reports +FMs, No LOF, no VB, no blurry vision, headaches or peripheral edema, and RUQ pain.  She plans on bottle feeding. She request OCPs for birth control, declines any other form of BC.  Dating: By 557w1d dating sono --->  Estimated Date of Delivery: 02/17/14  Prenatal History/Complications:  Past Medical History: Past Medical History  Diagnosis Date  . Chlamydia   . Medical history non-contributory     Past Surgical History: Past Surgical History  Procedure Laterality Date  . Dilation and curettage of uterus      Obstetrical History: OB History    Gravida Para Term Preterm AB TAB SAB Ectopic Multiple Living   2 0   1  1   0      Social History: History   Social History  . Marital Status: Single    Spouse Name: N/A    Number of Children: N/A  . Years of Education: N/A   Social History Main Topics  . Smoking status: Former Games developermoker  . Smokeless tobacco: None  . Alcohol Use: No  . Drug Use: No  . Sexual Activity: No   Other Topics Concern  . None   Social History Narrative    Family History: Family History  Problem Relation Age of Onset  . Diabetes Mother     Allergies: No Known Allergies  Prescriptions prior to admission  Medication Sig Dispense Refill Last Dose  . Prenatal Vit-Fe Fumarate-FA (PRENATAL MULTIVITAMIN) TABS tablet Take 1 tablet by mouth daily at 12 noon.   02/23/2014 at Unknown time  . nitrofurantoin, macrocrystal-monohydrate, (MACROBID) 100 MG capsule Take 1 capsule (100 mg total) by mouth 2 (two) times daily. (Patient not taking: Reported on 01/21/2014) 14 capsule 0 Completed Course at Unknown time  . terconazole (TERAZOL 7) 0.4 % vaginal cream Place 1 applicator vaginally at bedtime. x 7 nights (Patient not taking: Reported on 01/21/2014) 45 g 0 Completed Course at  Unknown time     Review of Systems   All systems reviewed and negative except as stated in HPI  Blood pressure 116/68, pulse 90, temperature 98.5 F (36.9 C), resp. rate 18, height 5' 8.5" (1.74 m), weight 245 lb (111.131 kg), last menstrual period 04/19/2013. General appearance: alert and cooperative Lungs: clear to auscultation bilaterally Heart: regular rate and rhythm Abdomen: soft, non-tender; bowel sounds normal Extremities: Homans sign is negative, no sign of DVT Presentation: cephalic  Dilation: 1.5 Effacement (%): 50 Station: -2 Exam by:: Loreta AveAcosta   Prenatal labs: ABO, Rh: --/--/O POS (01/19 1235) Antibody: NEG (01/19 1235) Rubella:   RPR: Nonreactive (06/29 0000)  HBsAg: Negative (06/29 0000)  HIV: Non-reactive (06/29 0000)  GBS: Negative (12/22 0000)  1 hr Glucola 107, 2nd 135 => 80/112/128/101 Genetic screening  neg Anatomy US normal   Results for orders placed or performed during the hospital encounter of 02/23/14 (from the past 24 hour(s))  CBC   Collection Time: 02/23/14 12:35 PM  Result Value Ref Range   WBC 8.0 4.0 - 10.5 K/uL   RBC 3.64 (L) 3.87 - 5.11 MIL/uL   Hemoglobin 10.2 (L) 12.0 - 15.0 g/dL   HCT 16.131.9 (L) 09.636.0 - 04.546.0 %   MCV 87.6 78.0 - 100.0 fL   MCH 28.0 26.0 - 34.0 pg   MCHC 32.0 30.0 - 36.0 g/dL  RDW 14.6 11.5 - 15.5 %   Platelets 190 150 - 400 K/uL  Type and screen   Collection Time: 02/23/14 12:35 PM  Result Value Ref Range   ABO/RH(D) O POS    Antibody Screen NEG    Sample Expiration 02/26/2014     Patient Active Problem List   Diagnosis Date Noted  . Pregnancy 02/23/2014  . Post-term pregnancy, 40-42 weeks of gestation     Assessment: Mercedes Riley is a 29 y.o. G2P0010 at [redacted]w[redacted]d here for IOL 2/2 NRNST  #Labor: FB placed, no cytotec given still cat II strip #Pain: Fentanyl prn #FWB: Cat II #ID:  GBS neg #MOF: bottle #MOC:OCPs, declines all other LARCs #Circ:  outpt  Mercedes Riley 02/23/2014, 5:01  PM

## 2014-02-23 NOTE — Progress Notes (Signed)
Patient ID: Lollie MarrowJessica R Kimoto, female   DOB: Feb 01, 1986, 29 y.o.   MRN: [id number] Lollie MarrowJessica R Granillo is a 29 y.o. G2P0010 at 361w6d.  Subjective: Cramping. Pain controlled w/ fentanyl.  Objective: BP 118/72 mmHg  Pulse 93  Temp(Src) 98.2 F (36.8 C) (Oral)  Resp 16  Ht 5' 8.5" (1.74 m)  Wt 111.131 kg (245 lb)  BMI 36.71 kg/m2  LMP 04/19/2013   FHT:  FHR: 130 bpm, variability: mod,  accelerations:  15x15,  decelerations:  none UC:   Q 2-5 minutes, mild  Dilation: 4 Effacement (%): 50 Cervical Position: Posterior Station: -3 Presentation: Vertex Exam by:: Leyana Whidden cnm  Foley Bulb out  Labs: Results for orders placed or performed during the hospital encounter of 02/23/14 (from the past 24 hour(s))  CBC     Status: Abnormal   Collection Time: 02/23/14 12:35 PM  Result Value Ref Range   WBC 8.0 4.0 - 10.5 K/uL   RBC 3.64 (L) 3.87 - 5.11 MIL/uL   Hemoglobin 10.2 (L) 12.0 - 15.0 g/dL   HCT 40.931.9 (L) 81.136.0 - 91.446.0 %   MCV 87.6 78.0 - 100.0 fL   MCH 28.0 26.0 - 34.0 pg   MCHC 32.0 30.0 - 36.0 g/dL   RDW 78.214.6 95.611.5 - 21.315.5 %   Platelets 190 150 - 400 K/uL  Type and screen     Status: None   Collection Time: 02/23/14 12:35 PM  Result Value Ref Range   ABO/RH(D) O POS    Antibody Screen NEG    Sample Expiration 02/26/2014     Assessment / Plan: 6361w6d week IUP Labor: Early Fetal Wellbeing:  Category I Pain Control:  Fentanyl Anticipated MOD:  NSVD May gave epidural. Watch labor progress. Baby Leopolds LGA, but pt was 9-11 baby.   [place] Riverlyn Kizziah, CNM 02/23/2014 11:11 PM

## 2014-02-24 ENCOUNTER — Inpatient Hospital Stay (HOSPITAL_COMMUNITY): Payer: Medicaid Other | Admitting: Anesthesiology

## 2014-02-24 ENCOUNTER — Encounter (HOSPITAL_COMMUNITY): Payer: Self-pay | Admitting: *Deleted

## 2014-02-24 DIAGNOSIS — Z3A40 40 weeks gestation of pregnancy: Secondary | ICD-10-CM

## 2014-02-24 LAB — HIV ANTIBODY (ROUTINE TESTING W REFLEX): HIV-1/HIV-2 Ab: NONREACTIVE

## 2014-02-24 LAB — RPR: RPR Ser Ql: NONREACTIVE

## 2014-02-24 MED ORDER — BUPIVACAINE HCL (PF) 0.25 % IJ SOLN
INTRAMUSCULAR | Status: DC | PRN
  Administered 2014-02-24 (×2): 4 mL via EPIDURAL

## 2014-02-24 MED ORDER — DIPHENHYDRAMINE HCL 50 MG/ML IJ SOLN: 12.5000 mg | INTRAMUSCULAR | Status: DC | PRN

## 2014-02-24 MED ORDER — BENZOCAINE-MENTHOL 20-0.5 % EX AERO
1.0000 "application " | INHALATION_SPRAY | CUTANEOUS | Status: DC | PRN
  Filled 2014-02-24: qty 56

## 2014-02-24 MED ORDER — EPHEDRINE 5 MG/ML INJ
10.0000 mg | INTRAVENOUS | Status: DC | PRN
  Filled 2014-02-24: qty 2

## 2014-02-24 MED ORDER — METHYLERGONOVINE MALEATE 0.2 MG/ML IJ SOLN
0.2000 mg | Freq: Once | INTRAMUSCULAR | Status: AC
  Administered 2014-02-24: 0.2 mg via INTRAMUSCULAR

## 2014-02-24 MED ORDER — LIDOCAINE-EPINEPHRINE (PF) 2 %-1:200000 IJ SOLN
INTRAMUSCULAR | Status: DC | PRN
  Administered 2014-02-24: 3 mL

## 2014-02-24 MED ORDER — PHENYLEPHRINE 40 MCG/ML (10ML) SYRINGE FOR IV PUSH (FOR BLOOD PRESSURE SUPPORT)
80.0000 ug | PREFILLED_SYRINGE | INTRAVENOUS | Status: DC | PRN
  Filled 2014-02-24: qty 2

## 2014-02-24 MED ORDER — PRENATAL MULTIVITAMIN CH
1.0000 | ORAL_TABLET | Freq: Every day | ORAL | Status: DC
  Administered 2014-02-25 – 2014-02-26 (×2): 1 via ORAL
  Filled 2014-02-24 (×2): qty 1

## 2014-02-24 MED ORDER — ONDANSETRON HCL 4 MG/2ML IJ SOLN: 4.0000 mg | INTRAMUSCULAR | Status: DC | PRN

## 2014-02-24 MED ORDER — SENNOSIDES-DOCUSATE SODIUM 8.6-50 MG PO TABS
2.0000 | ORAL_TABLET | ORAL | Status: DC
  Administered 2014-02-25 (×2): 2 via ORAL
  Filled 2014-02-24 (×2): qty 2

## 2014-02-24 MED ORDER — PHENYLEPHRINE 40 MCG/ML (10ML) SYRINGE FOR IV PUSH (FOR BLOOD PRESSURE SUPPORT)
80.0000 ug | PREFILLED_SYRINGE | INTRAVENOUS | Status: DC | PRN
  Filled 2014-02-24: qty 20
  Filled 2014-02-24: qty 2

## 2014-02-24 MED ORDER — DIPHENHYDRAMINE HCL 25 MG PO CAPS: 25.0000 mg | ORAL_CAPSULE | Freq: Four times a day (QID) | ORAL | Status: DC | PRN

## 2014-02-24 MED ORDER — FENTANYL 2.5 MCG/ML BUPIVACAINE 1/10 % EPIDURAL INFUSION (WH - ANES)
14.0000 mL/h | INTRAMUSCULAR | Status: DC | PRN
  Administered 2014-02-24 (×3): 14 mL/h via EPIDURAL
  Filled 2014-02-24 (×3): qty 125

## 2014-02-24 MED ORDER — LANOLIN HYDROUS EX OINT: TOPICAL_OINTMENT | CUTANEOUS | Status: DC | PRN

## 2014-02-24 MED ORDER — FENTANYL 2.5 MCG/ML BUPIVACAINE 1/10 % EPIDURAL INFUSION (WH - ANES)
INTRAMUSCULAR | Status: DC | PRN
  Administered 2014-02-24: 14 mL/h via EPIDURAL

## 2014-02-24 MED ORDER — SIMETHICONE 80 MG PO CHEW: 80.0000 mg | CHEWABLE_TABLET | ORAL | Status: DC | PRN

## 2014-02-24 MED ORDER — WITCH HAZEL-GLYCERIN EX PADS: 1.0000 "application " | MEDICATED_PAD | CUTANEOUS | Status: DC | PRN

## 2014-02-24 MED ORDER — ONDANSETRON HCL 4 MG PO TABS: 4.0000 mg | ORAL_TABLET | ORAL | Status: DC | PRN

## 2014-02-24 MED ORDER — TETANUS-DIPHTH-ACELL PERTUSSIS 5-2.5-18.5 LF-MCG/0.5 IM SUSP
0.5000 mL | Freq: Once | INTRAMUSCULAR | Status: AC
  Administered 2014-02-26: 0.5 mL via INTRAMUSCULAR
  Filled 2014-02-24: qty 0.5

## 2014-02-24 MED ORDER — LACTATED RINGERS IV SOLN
500.0000 mL | Freq: Once | INTRAVENOUS | Status: AC
  Administered 2014-02-24: 500 mL via INTRAVENOUS

## 2014-02-24 MED ORDER — IBUPROFEN 600 MG PO TABS
600.0000 mg | ORAL_TABLET | Freq: Four times a day (QID) | ORAL | Status: DC
  Administered 2014-02-25 – 2014-02-26 (×6): 600 mg via ORAL
  Filled 2014-02-24 (×7): qty 1

## 2014-02-24 MED ORDER — DIBUCAINE 1 % RE OINT: 1.0000 "application " | TOPICAL_OINTMENT | RECTAL | Status: DC | PRN

## 2014-02-24 NOTE — Progress Notes (Signed)
Patient ID: Mercedes MarrowJessica R Riley, female   DOB: 11/26/1985, 29 y.o.   MRN: [id number]   Mercedes Riley is G2P0010 at 745w0d here for labor. She has been complete since around 1300. We had Mercedes Riley labor down and adjusted epidural to help with pain during this time. At around 1600, bedside ultrasound revealed op position. At 1700, we advised nursing to encourage Mercedes Riley to actively push.

## 2014-02-24 NOTE — Anesthesia Procedure Notes (Signed)
Epidural Patient location during procedure: OB  Staffing Anesthesiologist: Abdulraheem Pineo, CHRIS Performed by: anesthesiologist   Preanesthetic Checklist Completed: patient identified, surgical consent, pre-op evaluation, timeout performed, IV checked, risks and benefits discussed and monitors and equipment checked  Epidural Patient position: sitting Prep: site prepped and draped and DuraPrep Patient monitoring: heart rate, cardiac monitor, continuous pulse ox and blood pressure Approach: midline Location: L4-L5 Injection technique: LOR saline  Needle:  Needle type: Tuohy  Needle gauge: 17 G Needle length: 9 cm Needle insertion depth: 8 cm Catheter type: closed end flexible Catheter size: 19 Gauge Catheter at skin depth: 13 cm Test dose: 2% lidocaine with Epi 1:200 K and negative  Assessment Events: blood not aspirated, injection not painful, no injection resistance, negative IV test and no paresthesia  Additional Notes H+P and labs checked, risks and benefits discussed with the patient, consent obtained, procedure tolerated well and without complications.  Reason for block:procedure for pain

## 2014-02-24 NOTE — Plan of Care (Signed)
Problem: Consults Goal: Birthing Suites Patient Information Press F2 to bring up selections list   Pt > [redacted] weeks EGA     

## 2014-02-24 NOTE — Plan of Care (Signed)
Problem: Consults Goal: Birthing Suites Patient Information Press F2 to bring up selections list  Outcome: Completed/Met Date Met:  02/24/14  Pt > [redacted] weeks EGA and Inpatient induction

## 2014-02-24 NOTE — Anesthesia Preprocedure Evaluation (Signed)
Anesthesia Evaluation  Patient identified by MRN, date of birth, ID band Patient awake    Reviewed: Allergy & Precautions, NPO status , Patient's Chart, lab work & pertinent test results  History of Anesthesia Complications Negative for: history of anesthetic complications  Airway Mallampati: III  TM Distance: >3 FB Neck ROM: Full    Dental  (+) Teeth Intact   Pulmonary neg shortness of breath, neg sleep apnea, neg COPDneg recent URI, former smoker,  breath sounds clear to auscultation        Cardiovascular negative cardio ROS  Rhythm:Regular     Neuro/Psych negative neurological ROS  negative psych ROS   GI/Hepatic negative GI ROS, Neg liver ROS,   Endo/Other  negative endocrine ROS  Renal/GU negative Renal ROS     Musculoskeletal   Abdominal   Peds  Hematology  (+) anemia ,   Anesthesia Other Findings   Reproductive/Obstetrics (+) Pregnancy                             Anesthesia Physical Anesthesia Plan  ASA: II  Anesthesia Plan: Epidural   Post-op Pain Management:    Induction:   Airway Management Planned:   Additional Equipment:   Intra-op Plan:   Post-operative Plan:   Informed Consent: I have reviewed the patients History and Physical, chart, labs and discussed the procedure including the risks, benefits and alternatives for the proposed anesthesia with the patient or authorized representative who has indicated his/her understanding and acceptance.     Plan Discussed with: Anesthesiologist  Anesthesia Plan Comments:         Anesthesia Quick Evaluation

## 2014-02-25 LAB — CBC
HCT: 29.8 % — ABNORMAL LOW (ref 36.0–46.0)
HEMOGLOBIN: 9.8 g/dL — AB (ref 12.0–15.0)
MCH: 28.6 pg (ref 26.0–34.0)
MCHC: 32.9 g/dL (ref 30.0–36.0)
MCV: 86.9 fL (ref 78.0–100.0)
PLATELETS: 185 10*3/uL (ref 150–400)
RBC: 3.43 MIL/uL — ABNORMAL LOW (ref 3.87–5.11)
RDW: 14.9 % (ref 11.5–15.5)
WBC: 18.5 10*3/uL — ABNORMAL HIGH (ref 4.0–10.5)

## 2014-02-25 MED ORDER — OXYCODONE-ACETAMINOPHEN 5-325 MG PO TABS
1.0000 | ORAL_TABLET | Freq: Four times a day (QID) | ORAL | Status: DC | PRN
  Administered 2014-02-25: 1 via ORAL
  Filled 2014-02-25: qty 1

## 2014-02-25 NOTE — Anesthesia Postprocedure Evaluation (Signed)
Anesthesia Post Note  Patient: Mercedes Riley  Procedure(s) Performed: * No procedures listed *  Anesthesia type: Epidural  Patient location: Mother/Baby  Post pain: Pain level controlled  Post assessment: Post-op Vital signs reviewed  Last Vitals:  Filed Vitals:   02/25/14 0400  BP: 113/70  Pulse: 78  Temp: 36.8 C  Resp: 18    Post vital signs: Reviewed  Level of consciousness:alert  Complications: No apparent anesthesia complications

## 2014-02-25 NOTE — Progress Notes (Signed)
Post Partum Day #1 Subjective: no complaints, up ad lib, voiding, tolerating PO and has not had a BM or flatus. Breastfeeding with no complications.   Objective: Blood pressure 113/70, pulse 78, temperature 98.2 F (36.8 C), temperature source Oral, resp. rate 18, height 5' 8.5" (1.74 m), weight 111.131 kg (245 lb), last menstrual period 04/19/2013, SpO2 100 %, unknown if currently breastfeeding.  Physical Exam:  General: alert, cooperative and no distress Lochia: appropriate Heart: nml heart sounds Lungs: nml breath sounds Uterine Fundus: firm Incision: n/a DVT Evaluation: No evidence of DVT seen on physical exam.   Recent Labs  02/23/14 1235 02/25/14 0551  HGB 10.2* 9.8*  HCT 31.9* 29.8*   Results for orders placed or performed during the hospital encounter of 02/23/14 (from the past 72 hour(s))  CBC     Status: Abnormal   Collection Time: 02/23/14 12:35 PM  Result Value Ref Range   WBC 8.0 4.0 - 10.5 K/uL   RBC 3.64 (L) 3.87 - 5.11 MIL/uL   Hemoglobin 10.2 (L) 12.0 - 15.0 g/dL   HCT 16.131.9 (L) 09.636.0 - 04.546.0 %   MCV 87.6 78.0 - 100.0 fL   MCH 28.0 26.0 - 34.0 pg   MCHC 32.0 30.0 - 36.0 g/dL   RDW 40.914.6 81.111.5 - 91.415.5 %   Platelets 190 150 - 400 K/uL  RPR     Status: None   Collection Time: 02/23/14 12:35 PM  Result Value Ref Range   RPR Ser Ql Non Reactive Non Reactive    Comment: (NOTE) Performed At: [facility] [place] [address] [place], [place] [id number] Mila HomerHancock William F MD [id number]:[phone]   HIV antibody     Status: None   Collection Time: 02/23/14 12:35 PM  Result Value Ref Range   HIV 1/O/2 Abs-Index Value <1.00 <1.00    Comment: Index Value: Specimen reactivity relative to the negative cutoff.   HIV-1/HIV-2 Ab Non Reactive Non Reactive    Comment: (NOTE) Performed At: [facility] [place] [address] [place], [place] [id number] Mila HomerHancock William F MD WN:[id number]:[phone]   Type and screen     Status: None   Collection Time: 02/23/14 12:35 PM  Result Value Ref  Range   ABO/RH(D) O POS    Antibody Screen NEG    Sample Expiration 02/26/2014   CBC     Status: Abnormal   Collection Time: 02/25/14  5:51 AM  Result Value Ref Range   WBC 18.5 (H) 4.0 - 10.5 K/uL   RBC 3.43 (L) 3.87 - 5.11 MIL/uL   Hemoglobin 9.8 (L) 12.0 - 15.0 g/dL   HCT 03.429.8 (L) 74.236.0 - 59.546.0 %   MCV 86.9 78.0 - 100.0 fL   MCH 28.6 26.0 - 34.0 pg   MCHC 32.9 30.0 - 36.0 g/dL   RDW 63.814.9 75.611.5 - 43.315.5 %   Platelets 185 150 - 400 K/uL    Assessment/Plan: Plan for discharge tomorrow, Breastfeeding and Contraception OCPs. Circumcision OP via pediatrician.    LOS: 2 days   Deborha PaymentMeyer, Ashley L PA-S 02/25/2014, 8:58 AM   I examined pt and agree with documentation above and PA-S plan of care. Eino FarberWalidah Kennith GainN Karim, CNM

## 2014-02-25 NOTE — Lactation Note (Signed)
This note was copied from the chart of Boy Boone MasterJessica Riley. Lactation Consultation Note New mom has semi flat large pendulum breast, rolls nipples in finger tips pulls out well. Short shaft nipple. Large baby latched well with a lot of assistance w/tea-cup hold. Needed lots of information gone over several times. Mom wanting to learn. Mom encouraged to feed baby 8-12 times/24 hours and with feeding cues. Hand expression taught to Mom. No colostrum noted at this time. Mom stated that she has seen some drainage at times from nipples.  Educated about newborn behavior.  Mom encouraged to do skin-to-skin. Mom encouraged to waken baby for feeds. Swallows heard.Mom placed in shells and encouraged to wear them between feedings. Referred to Baby and Me Book in Breastfeeding section Pg. 22-23 for position options and Proper latch demonstration.[facility] brochure given w/resources, support groups and LC services. Patient Name: Boy Boone MasterJessica Eischen ZOXWR'UToday's Date: 02/25/2014 Reason for consult: Initial assessment   Maternal Data Has patient been taught Hand Expression?: Yes Does the patient have breastfeeding experience prior to this delivery?: No  Feeding Feeding Type: Breast Fed Length of feed: 0 min (no latch)  LATCH Score/Interventions Latch: Repeated attempts needed to sustain latch, nipple held in mouth throughout feeding, stimulation needed to elicit sucking reflex. Intervention(s): Skin to skin;Teach feeding cues;Waking techniques Intervention(s): Adjust position;Assist with latch;Breast massage;Breast compression  Audible Swallowing: A few with stimulation Intervention(s): Skin to skin;Hand expression Intervention(s): Hand expression;Alternate breast massage  Type of Nipple: Everted at rest and after stimulation (semi flat)  Comfort (Breast/Nipple): Soft / non-tender     Hold (Positioning): Assistance needed to correctly position infant at breast and maintain latch. Intervention(s): Breastfeeding  basics reviewed;Support Pillows;Position options;Skin to skin  LATCH Score: 7  Lactation Tools Discussed/Used Tools: Shells;Pump Shell Type: Inverted Breast pump type: Manual Pump Review: Setup, frequency, and cleaning;Milk Storage Initiated by:: Peri JeffersonL. Elize Pinon RN Date initiated:: 02/25/14   Consult Status Consult Status: Follow-up Date: 02/26/14 Follow-up type: In-patient    Mercedes Riley, Diamond NickelLAURA Riley 02/25/2014, 4:30 AM

## 2014-02-25 NOTE — Progress Notes (Signed)
Ur chart review completed.  

## 2014-02-25 NOTE — Progress Notes (Signed)
Post Partum Day #1  Subjective: No complaints, up ad lib, voiding, tolerating PO and has not had a BM or flatus. Breastfeeding with no complications. No pelvic pain reported.  Objective: Blood pressure 113/70, pulse 78, temperature 98.2 F (36.8 C), temperature source Oral, resp. rate 18, height 5' 8.5" (1.74 m), weight 111.131 kg (245 lb), last menstrual period 04/19/2013, SpO2 100 %, unknown if currently breastfeeding.  Physical Exam:  General: alert, cooperative and no distress Lochia: appropriate Heart: nml heart sounds Lungs: nml breath sounds Uterine Fundus: firm Incision: n/a DVT Evaluation: No evidence of DVT seen on physical exam.   Recent Labs (last 2 labs)      Recent Labs  02/23/14 1235 02/25/14 0551  HGB 10.2* 9.8*  HCT 31.9* 29.8*      Lab Results Past 72 Hours    Results for orders placed or performed during the hospital encounter of 02/23/14 (from the past 72 hour(s))  CBC Status: Abnormal   Collection Time: 02/23/14 12:35 PM  Result Value Ref Range   WBC 8.0 4.0 - 10.5 K/uL   RBC 3.64 (L) 3.87 - 5.11 MIL/uL   Hemoglobin 10.2 (L) 12.0 - 15.0 g/dL   HCT 29.531.9 (L) 62.136.0 - 30.846.0 %   MCV 87.6 78.0 - 100.0 fL   MCH 28.0 26.0 - 34.0 pg   MCHC 32.0 30.0 - 36.0 g/dL   RDW 65.714.6 84.611.5 - 96.215.5 %   Platelets 190 150 - 400 K/uL  RPR Status: None   Collection Time: 02/23/14 12:35 PM  Result Value Ref Range   RPR Ser Ql Non Reactive Non Reactive    Comment: (NOTE) Performed At: [facility] [place] [address] [place], [place] [id number] Mila HomerHancock William F MD [id number]:[phone]   HIV antibody Status: None   Collection Time: 02/23/14 12:35 PM  Result Value Ref Range   HIV 1/O/2 Abs-Index Value <1.00 <1.00    Comment: Index Value: Specimen reactivity relative to the negative cutoff.   HIV-1/HIV-2 Ab Non Reactive Non Reactive    Comment: (NOTE) Performed At: [facility]  [place] [address] [place], [place] [id number] Mila HomerHancock William F MD [id number]:[phone]   Type and screen Status: None   Collection Time: 02/23/14 12:35 PM  Result Value Ref Range   ABO/RH(D) O POS    Antibody Screen NEG    Sample Expiration 02/26/2014   CBC Status: Abnormal   Collection Time: 02/25/14 5:51 AM  Result Value Ref Range   WBC 18.5 (H) 4.0 - 10.5 K/uL   RBC 3.43 (L) 3.87 - 5.11 MIL/uL   Hemoglobin 9.8 (L) 12.0 - 15.0 g/dL   HCT 51.829.8 (L) 84.136.0 - 66.046.0 %   MCV 86.9 78.0 - 100.0 fL   MCH 28.6 26.0 - 34.0 pg   MCHC 32.9 30.0 - 36.0 g/dL   RDW 63.014.9 16.011.5 - 10.915.5 %   Platelets 185 150 - 400 K/uL      Assessment/Plan: Plan for discharge tomorrow, Breastfeeding and Contraception OCPs. Circumcision OP via pediatrician.    LOS: 2 days   Deborha PaymentMeyer, Ashley L PA-S 02/25/2014, 8:58 AM   .I examined pt and agree with documentation above and PA-S plan of care. Eino FarberWalidah Kennith GainN Karim, CNM

## 2014-02-25 NOTE — Lactation Note (Signed)
This note was copied from the chart of Mercedes Boone [name]. Lactation Consultation Note  Patient Name: Mercedes Riley ZOXWR'UToday's Date: 02/25/2014 Reason for consult: Follow-up assessment Mom reports baby has not latched since early this am. She was worried so she supplemented with formula. Mom's nipples are slightly flat, she has been wearing shells per her report although they are not on at present. She has hand pump to pre-pump to help with latch. Baby asleep at this visit and Mom has company. Advised to place baby STS in 30 minutes and try to wake baby. Advised to call for assist with next feeding.  Maternal Data    Feeding Feeding Type: Bottle Fed - Formula  LATCH Score/Interventions                      Lactation Tools Discussed/Used Tools: Pump;Shells Shell Type: Inverted Breast pump type: Manual   Consult Status Consult Status: Follow-up Date: 02/25/14 Follow-up type: In-patient    Alfred LevinsGranger, Voyd Groft Ann 02/25/2014, 4:20 PM

## 2014-02-26 MED ORDER — IBUPROFEN 600 MG PO TABS: 600.0000 mg | ORAL_TABLET | Freq: Four times a day (QID) | ORAL | Status: DC

## 2014-02-26 MED ORDER — NORETHINDRONE 0.35 MG PO TABS: 1.0000 | ORAL_TABLET | Freq: Every day | ORAL | Status: DC

## 2014-02-26 NOTE — Discharge Instructions (Signed)

## 2014-02-26 NOTE — Lactation Note (Signed)
This note was copied from the chart of Mercedes Boone MasterJessica Riley. Lactation Consultation Note: Follow up visit with mom before DC. She reports that baby has been nursing better with no pain. Has been giving some bottles of formula through the night. Encouraged to always BF first to promote a good milk supply.  Discussed engorgement prevention and treatment. Mom reports baby just finished feeding for 20 min, No questions at present. To call prn  Patient Name: Mercedes Boone MasterJessica Riley ZOXWR'UToday's Date: 02/26/2014 Reason for consult: Follow-up assessment   Maternal Data Formula Feeding for Exclusion: No Does the patient have breastfeeding experience prior to this delivery?: No  Feeding    LATCH Score/Interventions                      Lactation Tools Discussed/Used     Consult Status Consult Status: Complete    Pamelia HoitWeeks, Adreyan Carbajal D 02/26/2014, 8:05 AM

## 2014-02-26 NOTE — Discharge Summary (Signed)
Obstetric Discharge Summary Reason for Admission: induction of labor d/t NR NST Prenatal Procedures: NST and ultrasound Intrapartum Procedures: spontaneous vaginal delivery Postpartum Procedures: none Complications-Operative and Postpartum: none  Hospital Course: Underwent IOL for NR NST.  24 hours or so later, she had an uncomplicated SVD.Postpartum course uneventful.  Delivery Note At 7:40 PM a viable female was delivered via Vaginal, Spontaneous Delivery (Presentation: Right Occiput Anterior).  APGAR: 7, 9; weight 9 lb 3.6 oz (4185 g).   Placenta status: Intact, Spontaneous.  Cord: 3 vessels with the following complications: None.    Anesthesia: Epidural  Episiotomy: None Lacerations: 3rd degree Est. Blood Loss (mL): 500    H/H: Lab Results  Component Value Date/Time   HGB 9.8* 02/25/2014 05:51 AM   HCT 29.8* 02/25/2014 05:51 AM      Discharge Diagnoses: Term Pregnancy-delivered  Discharge Information: Date: 08/17/2010 Activity: pelvic rest Diet: routine  Medications: Ibuprofen Breast feeding:  Yes Condition: stable Instructions: refer to handout Discharge to: home      Medication List    STOP taking these medications        nitrofurantoin (macrocrystal-monohydrate) 100 MG capsule  Commonly known as:  MACROBID     prenatal multivitamin Tabs tablet     terconazole 0.4 % vaginal cream  Commonly known as:  TERAZOL 7      TAKE these medications        ibuprofen 600 MG tablet  Commonly known as:  ADVIL,MOTRIN  Take 1 tablet (600 mg total) by mouth every 6 (six) hours.     norethindrone 0.35 MG tablet  Commonly known as:  ORTHO MICRONOR  Take 1 tablet (0.35 mg total) by mouth daily.       Follow-up Information    [facility] with [facility]. Schedule an appointment as soon as possible for a visit in 6 weeks.   Why:  postpartum checkup      CRESENZO-DISHMAN,Noelle Hoogland 02/26/2014,7:19 AM

## 2014-02-27 ENCOUNTER — Inpatient Hospital Stay (HOSPITAL_COMMUNITY): Admission: RE | Admit: 2014-02-27 | Payer: Medicaid Other | Source: Ambulatory Visit

## 2015-05-07 ENCOUNTER — Inpatient Hospital Stay (HOSPITAL_COMMUNITY)
Admission: AD | Admit: 2015-05-07 | Discharge: 2015-05-07 | Disposition: A | Discharge status: Home / Self Care | Payer: Medicaid Other | Source: Ambulatory Visit | Attending: Obstetrics & Gynecology | Admitting: Obstetrics & Gynecology

## 2015-05-07 ENCOUNTER — Encounter (HOSPITAL_COMMUNITY): Payer: Self-pay | Admitting: *Deleted

## 2015-05-07 DIAGNOSIS — Z3202 Encounter for pregnancy test, result negative: Secondary | ICD-10-CM | POA: Insufficient documentation

## 2015-05-07 DIAGNOSIS — N939 Abnormal uterine and vaginal bleeding, unspecified: Secondary | ICD-10-CM | POA: Insufficient documentation

## 2015-05-07 DIAGNOSIS — E282 Polycystic ovarian syndrome: Secondary | ICD-10-CM | POA: Diagnosis not present

## 2015-05-07 DIAGNOSIS — N926 Irregular menstruation, unspecified: Secondary | ICD-10-CM | POA: Diagnosis not present

## 2015-05-07 DIAGNOSIS — F17210 Nicotine dependence, cigarettes, uncomplicated: Secondary | ICD-10-CM | POA: Diagnosis not present

## 2015-05-07 HISTORY — DX: Polycystic ovarian syndrome: E28.2

## 2015-05-07 LAB — CBC
HEMATOCRIT: 36.5 % (ref 36.0–46.0)
HEMOGLOBIN: 12.2 g/dL (ref 12.0–15.0)
MCH: 32 pg (ref 26.0–34.0)
MCHC: 33.4 g/dL (ref 30.0–36.0)
MCV: 95.8 fL (ref 78.0–100.0)
Platelets: 236 10*3/uL (ref 150–400)
RBC: 3.81 MIL/uL — ABNORMAL LOW (ref 3.87–5.11)
RDW: 12.9 % (ref 11.5–15.5)
WBC: 8.4 10*3/uL (ref 4.0–10.5)

## 2015-05-07 LAB — URINE MICROSCOPIC-ADD ON: BACTERIA UA: NONE SEEN

## 2015-05-07 LAB — URINALYSIS, ROUTINE W REFLEX MICROSCOPIC
Bilirubin Urine: NEGATIVE
Glucose, UA: NEGATIVE mg/dL
KETONES UR: NEGATIVE mg/dL
LEUKOCYTES UA: NEGATIVE
Nitrite: NEGATIVE
PH: 7 (ref 5.0–8.0)
Protein, ur: NEGATIVE mg/dL
Specific Gravity, Urine: 1.015 (ref 1.005–1.030)

## 2015-05-07 LAB — POCT PREGNANCY, URINE: Preg Test, Ur: NEGATIVE

## 2015-05-07 NOTE — MAU Note (Signed)
C/o heavy vaginal bleeding that started 4 days ago; placed her contraceptive patch on 5 days ago; denies any pain; has been on the patch for pass 6 months;

## 2015-05-07 NOTE — MAU Provider Note (Signed)
History     CSN: [id number]  Arrival date and time: 05/07/15 1054   First Provider Initiated Contact with Patient 05/07/15 1139      Chief Complaint  Patient presents with  . Vaginal Bleeding   HPI   [name] is a 30 y.o. female G2P1011 presenting to MAU with abnormal, heavy vaginal bleeding. She uses the patch for birthcontrol and has used this for 6 months. She started her cycle last Tuesday, which was not a normal time for her to start. She started her normal cycle on March 17.   In the last 24 hours her period has been heavy, heavier than normal. When she woke up this morning it was very heavy and that worried her so she came in to MAU.   OB History    Gravida Para Term Preterm AB TAB SAB Ectopic Multiple Living   0 1      Past Medical History  Diagnosis Date  . Chlamydia   . PCOS (polycystic ovarian syndrome)     Past Surgical History  Procedure Laterality Date  . Dilation and curettage of uterus      Family History  Problem Relation Age of Onset  . Diabetes Mother     Social History  Substance Use Topics  . Smoking status: Current Every Day Smoker -- 0.25 packs/day  . Smokeless tobacco: None  . Alcohol Use: No    Allergies: No Known Allergies  Prescriptions prior to admission  Medication Sig Dispense Refill Last Dose  . norelgestromin-ethinyl estradiol [facility]) 150-35 MCG/24HR transdermal patch Place 1 patch onto the skin once a week.    Past Week at Unknown time  . ibuprofen (ADVIL,MOTRIN) 600 MG tablet Take 1 tablet (600 mg total) by mouth every 6 (six) hours. (Patient not taking: Reported on 05/07/2015) 30 tablet 0 Not Taking at Unknown time  . norethindrone (ORTHO MICRONOR) 0.35 MG tablet Take 1 tablet (0.35 mg total) by mouth daily. (Patient not taking: Reported on 05/07/2015) 1 Package 11 Not Taking at Unknown time   Results for orders placed or performed during the hospital encounter of 05/07/15 (from the past 48 hour(s))   Urinalysis, Routine w reflex microscopic (not at [facility])     Status: Abnormal   Collection Time: 05/07/15 11:00 AM  Result Value Ref Range   Color, Urine YELLOW YELLOW   APPearance CLEAR CLEAR   Specific Gravity, Urine 1.015 1.005 - 1.030   pH 7.0 5.0 - 8.0   Glucose, UA NEGATIVE NEGATIVE mg/dL   Hgb urine dipstick LARGE (A) NEGATIVE   Bilirubin Urine NEGATIVE NEGATIVE   Ketones, ur NEGATIVE NEGATIVE mg/dL   Protein, ur NEGATIVE NEGATIVE mg/dL   Nitrite NEGATIVE NEGATIVE   Leukocytes, UA NEGATIVE NEGATIVE  Urine microscopic-add on     Status: Abnormal   Collection Time: 05/07/15 11:00 AM  Result Value Ref Range   Squamous Epithelial / LPF 0-5 (A) NONE SEEN   WBC, UA 0-5 0 - 5 WBC/hpf   RBC / HPF 6-30 0 - 5 RBC/hpf   Bacteria, UA NONE SEEN NONE SEEN   Urine-Other MUCOUS PRESENT   CBC     Status: Abnormal   Collection Time: 05/07/15 11:05 AM  Result Value Ref Range   WBC 8.4 4.0 - 10.5 K/uL   RBC 3.81 (L) 3.87 - 5.11 MIL/uL   Hemoglobin 12.2 12.0 - 15.0 g/dL   HCT 28.4 13.2 - 44.0 %   MCV 95.8  78.0 - 100.0 fL   MCH 32.0 26.0 - 34.0 pg   MCHC 33.4 30.0 - 36.0 g/dL   RDW 14.712.9 82.911.5 - 56.215.5 %   Platelets 236 150 - 400 K/uL  Pregnancy, urine POC     Status: None   Collection Time: 05/07/15 11:16 AM  Result Value Ref Range   Preg Test, Ur NEGATIVE NEGATIVE    Comment:        THE SENSITIVITY OF THIS METHODOLOGY IS >24 mIU/mL     Review of Systems  Constitutional: Negative for fever.  Gastrointestinal: Negative for abdominal pain.  Neurological: Negative for dizziness.   Physical Exam   Blood pressure 106/63, pulse 74, temperature 98.3 F (36.8 C), temperature source Oral, resp. rate 16, height 5\' 9"  (1.753 m), weight 227 lb 3.2 oz (103.057 kg), last menstrual period 04/22/2015, unknown if currently breastfeeding.  Physical Exam  Constitutional: She is oriented to person, place, and time. She appears well-developed and well-nourished. No distress.  HENT:  Head:  Normocephalic.  Eyes: Pupils are equal, round, and reactive to light.  Neck: Neck supple.  Cardiovascular: Normal rate.   Respiratory: Effort normal.  GI: Soft. She exhibits no distension. There is no tenderness. There is no rebound.  Genitourinary:  Speculum exam: Vagina - Scant amount of dark red blood in the vaginal canal.  Cervix - No active bleeding.  Bimanual exam: Cervix closed Uterus non tender, normal size Adnexa non tender, no masses bilaterally Chaperone present for exam.  Musculoskeletal: Normal range of motion.  Neurological: She is alert and oriented to person, place, and time.  Skin: Skin is warm. She is not diaphoretic.  Psychiatric: Her behavior is normal.    MAU Course  Procedures  None  MDM  Hgb stable. This is the first time she has had breakthrough bleeding and the bleeding is minimal now. Patient encouraged to keep her appointment with GYN next week.    Assessment and Plan   A:  1. Abnormal menstrual cycle     P:  Discharge home in stable condition Follow up with GYN next week Return to MAU for emergencies.    Duane LopeJennifer I Rasch, NP 05/07/2015 3:54 PM

## 2015-05-07 NOTE — MAU Note (Signed)
Has PCOS was put on BCP to control the bleeding about 5 months ago was switched from BCP to patch, started bleeding on Wednesday, for the last two days changing super plus tampons every hour, slacked off, this morning passing clots, no cramping. [facility], has frequent BV

## 2015-05-07 NOTE — Discharge Instructions (Signed)
Abnormal Uterine Bleeding °Abnormal uterine bleeding means bleeding from the vagina that is not your normal menstrual period. This can be: °· Bleeding or spotting between periods. °· Bleeding after sex (sexual intercourse). °· Bleeding that is heavier or more than normal. °· Periods that last longer than usual. °· Bleeding after menopause. °There are many problems that may cause this. Treatment will depend on the cause of the bleeding. Any kind of bleeding that is not normal should be reviewed by your doctor.  °HOME CARE °Watch your condition for any changes. These actions may lessen any discomfort you are having: °· Do not use tampons or douches as told by your doctor. °· Change your pads often. °You should get regular pelvic exams and Pap tests. Keep all appointments for tests as told by your doctor. °GET HELP IF: °· You are bleeding for more than 1 week. °· You feel dizzy at times. °GET HELP RIGHT AWAY IF:  °· You pass out. °· You have to change pads every 15 to 30 minutes. °· You have belly pain. °· You have a fever. °· You become sweaty or weak. °· You are passing large blood clots from the vagina. °· You feel sick to your stomach (nauseous) and throw up (vomit). °MAKE SURE YOU: °· Understand these instructions. °· Will watch your condition. °· Will get help right away if you are not doing well or get worse. °  °This information is not intended to replace advice given to you by your health care provider. Make sure you discuss any questions you have with your health care provider. °  °Document Released: 11/19/2008 Document Revised: 01/27/2013 Document Reviewed: 08/21/2012 °Elsevier Interactive Patient Education ©2016 [employer] ° °

## 2015-06-17 ENCOUNTER — Inpatient Hospital Stay (HOSPITAL_COMMUNITY)
Admission: AD | Admit: 2015-06-17 | Discharge: 2015-06-17 | Discharge status: Against Medical Advice | Payer: Medicaid Other | Source: Ambulatory Visit | Attending: Obstetrics & Gynecology | Admitting: Obstetrics & Gynecology

## 2015-06-17 DIAGNOSIS — L292 Pruritus vulvae: Secondary | ICD-10-CM | POA: Insufficient documentation

## 2015-06-17 LAB — URINALYSIS, ROUTINE W REFLEX MICROSCOPIC
BILIRUBIN URINE: NEGATIVE
Glucose, UA: NEGATIVE mg/dL
Hgb urine dipstick: NEGATIVE
Ketones, ur: NEGATIVE mg/dL
Leukocytes, UA: NEGATIVE
NITRITE: NEGATIVE
PH: 7 (ref 5.0–8.0)
Protein, ur: NEGATIVE mg/dL
SPECIFIC GRAVITY, URINE: 1.015 (ref 1.005–1.030)

## 2015-06-17 LAB — POCT PREGNANCY, URINE: PREG TEST UR: NEGATIVE

## 2015-06-17 NOTE — MAU Note (Signed)
Not in lobby#1 

## 2015-06-17 NOTE — MAU Note (Signed)
Not in lobby

## 2015-06-17 NOTE — MAU Note (Signed)
Has a tendency to get BV after her periods.  Is tired of this, wants to know if there is something that can be done. Started itching last night

## 2016-02-12 ENCOUNTER — Encounter (HOSPITAL_COMMUNITY): Payer: Self-pay

## 2016-02-12 ENCOUNTER — Inpatient Hospital Stay (HOSPITAL_COMMUNITY)
Admission: AD | Admit: 2016-02-12 | Discharge: 2016-02-12 | Disposition: A | Discharge status: Home / Self Care | Payer: Medicaid Other | Source: Ambulatory Visit | Attending: Obstetrics & Gynecology | Admitting: Obstetrics & Gynecology

## 2016-02-12 DIAGNOSIS — F17210 Nicotine dependence, cigarettes, uncomplicated: Secondary | ICD-10-CM | POA: Insufficient documentation

## 2016-02-12 DIAGNOSIS — N760 Acute vaginitis: Secondary | ICD-10-CM

## 2016-02-12 DIAGNOSIS — E282 Polycystic ovarian syndrome: Secondary | ICD-10-CM | POA: Insufficient documentation

## 2016-02-12 DIAGNOSIS — B9689 Other specified bacterial agents as the cause of diseases classified elsewhere: Secondary | ICD-10-CM | POA: Insufficient documentation

## 2016-02-12 LAB — URINALYSIS, ROUTINE W REFLEX MICROSCOPIC
BILIRUBIN URINE: NEGATIVE
Glucose, UA: NEGATIVE mg/dL
HGB URINE DIPSTICK: NEGATIVE
Ketones, ur: NEGATIVE mg/dL
NITRITE: NEGATIVE
Protein, ur: NEGATIVE mg/dL
SPECIFIC GRAVITY, URINE: 1.02 (ref 1.005–1.030)
pH: 7 (ref 5.0–8.0)

## 2016-02-12 LAB — WET PREP, GENITAL
Sperm: NONE SEEN
Trich, Wet Prep: NONE SEEN
YEAST WET PREP: NONE SEEN

## 2016-02-12 LAB — POCT PREGNANCY, URINE: Preg Test, Ur: NEGATIVE

## 2016-02-12 MED ORDER — METRONIDAZOLE 500 MG PO TABS: 500.0000 mg | ORAL_TABLET | Freq: Two times a day (BID) | ORAL | 0 refills | Status: DC

## 2016-02-12 NOTE — MAU Note (Signed)
Pt states that she saw "hair bump" on vagina last night. Today she began having intermittent pain in vagina-rates 8/10. Also has a thick, white vaginal discharge. Denies odor or itching. Denies vag bleeding. LMP: 01/23/2016

## 2016-02-12 NOTE — MAU Provider Note (Signed)
History   [id number]   Chief Complaint  Patient presents with  . Vaginal Pain  . Vaginal Discharge    HPI Mercedes Riley is a 31 y.o. female  G2P1011 here with report of vaginal discharge and generalized vaginal pain that started yesterday.  Pain is described as a shooting sensation.  Denies vaginal odor or itching.  No other symptoms reported.     Patient's last menstrual period was 01/23/2016.  OB History  Gravida Para Term Preterm AB Living  2 1 1   1 1   SAB TAB Ectopic Multiple Live Births  1     0 1    # Outcome Date GA Lbr Len/2nd Weight Sex Delivery Anes PTL Lv  2 Term 02/24/14 [redacted]w[redacted]d 10:33 / 06:37 9 lb 3.6 oz (4.185 kg) M Vag-Spont EPI  LIV     Birth Comments: none  1 SAB 2001 [redacted]w[redacted]d             Past Medical History:  Diagnosis Date  . Chlamydia   . PCOS (polycystic ovarian syndrome)     Family History  Problem Relation Age of Onset  . Diabetes Mother     Social History   Social History  . Marital status: Single    Spouse name: N/A  . Number of children: N/A  . Years of education: N/A   Social History Main Topics  . Smoking status: Current Every Day Smoker    Packs/day: 0.25  . Smokeless tobacco: None  . Alcohol use No  . Drug use: No  . Sexual activity: No   Other Topics Concern  . None   Social History Narrative  . None    No Known Allergies  No current facility-administered medications on file prior to encounter.    Current Outpatient Prescriptions on File Prior to Encounter  Medication Sig Dispense Refill  . norelgestromin-ethinyl estradiol [facility]) 150-35 MCG/24HR transdermal patch Place 1 patch onto the skin once a week.        Review of Systems  Constitutional: Negative for chills and fever.  Genitourinary: Positive for vaginal discharge and vaginal pain. Negative for menstrual problem, pelvic pain and vaginal bleeding.  All other systems reviewed and are negative.    Physical Exam   Vitals:   02/12/16 1918  BP: 103/69    Pulse: 90  Resp: 17  Temp: 98.2 F (36.8 C)  TempSrc: Oral  SpO2: 100%  Weight: 230 lb (104.3 kg)  Height: 5\' 9"  (1.753 m)    Physical Exam  Constitutional: She is oriented to person, place, and time. She appears well-developed and well-nourished. No distress.  HENT:  Head: Normocephalic.  Neck: Normal range of motion. Neck supple.  Cardiovascular: Normal rate, regular rhythm and normal heart sounds.   Respiratory: Effort normal and breath sounds normal. No respiratory distress.  GI: Soft. She exhibits no mass. There is no tenderness. There is no rebound, no guarding and no CVA tenderness.  Genitourinary:    Cervix exhibits no motion tenderness and no discharge. Vaginal discharge (white, creamy) found.  Musculoskeletal: Normal range of motion. She exhibits no edema.  Neurological: She is alert and oriented to person, place, and time.  Skin: Skin is warm and dry.  Psychiatric: She has a normal mood and affect.    MAU Course  Procedures  MDM Results for orders placed or performed during the hospital encounter of 02/12/16 (from the past 24 hour(s))  Urinalysis, Routine w reflex microscopic     Status: Abnormal  Collection Time: 02/12/16  7:06 PM  Result Value Ref Range   Color, Urine YELLOW YELLOW   APPearance CLEAR CLEAR   Specific Gravity, Urine 1.020 1.005 - 1.030   pH 7.0 5.0 - 8.0   Glucose, UA NEGATIVE NEGATIVE mg/dL   Hgb urine dipstick NEGATIVE NEGATIVE   Bilirubin Urine NEGATIVE NEGATIVE   Ketones, ur NEGATIVE NEGATIVE mg/dL   Protein, ur NEGATIVE NEGATIVE mg/dL   Nitrite NEGATIVE NEGATIVE   Leukocytes, UA TRACE (A) NEGATIVE   RBC / HPF 0-5 0 - 5 RBC/hpf   WBC, UA 0-5 0 - 5 WBC/hpf   Bacteria, UA RARE (A) NONE SEEN   Squamous Epithelial / LPF 0-5 (A) NONE SEEN   Mucous PRESENT   Pregnancy, urine POC     Status: None   Collection Time: 02/12/16  7:50 PM  Result Value Ref Range   Preg Test, Ur NEGATIVE NEGATIVE  Wet prep, genital     Status: Abnormal    Collection Time: 02/12/16  8:10 PM  Result Value Ref Range   Yeast Wet Prep HPF POC NONE SEEN NONE SEEN   Trich, Wet Prep NONE SEEN NONE SEEN   Clue Cells Wet Prep HPF POC PRESENT (A) NONE SEEN   WBC, Wet Prep HPF POC MODERATE (A) NONE SEEN   Sperm NONE SEEN      Assessment and Plan  Bacterial Vaginosis  Plan: Discharge home RX Flagyl 500 mg BID x 7 days Follow-up if no improvment   Marlis EdelsonWalidah N Karim, CNM 02/12/2016 8:57 PM

## 2016-02-12 NOTE — Discharge Instructions (Signed)

## 2016-02-13 LAB — GC/CHLAMYDIA PROBE AMP (~~LOC~~) NOT AT ARMC
Chlamydia: NEGATIVE
Neisseria Gonorrhea: NEGATIVE

## 2017-01-26 ENCOUNTER — Emergency Department (HOSPITAL_COMMUNITY)
Admission: EM | Admit: 2017-01-26 | Discharge: 2017-01-26 | Disposition: A | Discharge status: Home / Self Care | Payer: Self-pay | Attending: Emergency Medicine | Admitting: Emergency Medicine

## 2017-01-26 ENCOUNTER — Encounter (HOSPITAL_COMMUNITY): Payer: Self-pay | Admitting: *Deleted

## 2017-01-26 ENCOUNTER — Other Ambulatory Visit: Payer: Self-pay

## 2017-01-26 DIAGNOSIS — L03113 Cellulitis of right upper limb: Secondary | ICD-10-CM | POA: Insufficient documentation

## 2017-01-26 DIAGNOSIS — L02413 Cutaneous abscess of right upper limb: Secondary | ICD-10-CM | POA: Insufficient documentation

## 2017-01-26 DIAGNOSIS — L0291 Cutaneous abscess, unspecified: Secondary | ICD-10-CM

## 2017-01-26 DIAGNOSIS — F17200 Nicotine dependence, unspecified, uncomplicated: Secondary | ICD-10-CM | POA: Insufficient documentation

## 2017-01-26 MED ORDER — TRAMADOL HCL 50 MG PO TABS
50.0000 mg | ORAL_TABLET | Freq: Once | ORAL | Status: AC
  Administered 2017-01-26: 50 mg via ORAL
  Filled 2017-01-26: qty 1

## 2017-01-26 MED ORDER — IBUPROFEN 800 MG PO TABS
800.0000 mg | ORAL_TABLET | Freq: Once | ORAL | Status: AC
  Administered 2017-01-26: 800 mg via ORAL
  Filled 2017-01-26: qty 1

## 2017-01-26 MED ORDER — SULFAMETHOXAZOLE-TRIMETHOPRIM 800-160 MG PO TABS: 1.0000 | ORAL_TABLET | Freq: Two times a day (BID) | ORAL | 0 refills | Status: AC

## 2017-01-26 MED ORDER — SULFAMETHOXAZOLE-TRIMETHOPRIM 800-160 MG PO TABS
1.0000 | ORAL_TABLET | Freq: Once | ORAL | Status: AC
  Administered 2017-01-26: 1 via ORAL
  Filled 2017-01-26: qty 1

## 2017-01-26 NOTE — ED Provider Notes (Signed)
[facility]   CSN: [id number] Arrival date & time: 01/26/17  0026     History   Chief Complaint Chief Complaint  Patient presents with  . Abscess    HPI Mercedes Riley is a 31 y.o. female.  Presents to the emergency department for evaluation of a painful swollen area on her right forearm.  Symptoms began 3 or 4 days ago.  She has been squeezing on it, thinking that she could pop it but it has not helped.  She reports that the redness and pain has worsened.  She has not had any fever.      Past Medical History:  Diagnosis Date  . Chlamydia   . PCOS (polycystic ovarian syndrome)     Patient Active Problem List   Diagnosis Date Noted  . Pregnancy 02/23/2014  . Post-term pregnancy, 40-42 weeks of gestation     Past Surgical History:  Procedure Laterality Date  . DILATION AND CURETTAGE OF UTERUS      OB History    Gravida Para Term Preterm AB Living   2 1 1   1 1    SAB TAB Ectopic Multiple Live Births   1     0 1       Home Medications    Prior to Admission medications   Medication Sig Start Date End Date Taking? Authorizing Provider  metroNIDAZOLE (FLAGYL) 500 MG tablet Take 1 tablet (500 mg total) by mouth 2 (two) times daily. 02/12/16   Marlis EdelsonKarim, Walidah N, CNM  sulfamethoxazole-trimethoprim (BACTRIM DS,SEPTRA DS) 800-160 MG tablet Take 1 tablet by mouth 2 (two) times daily for 7 days. 01/26/17 02/02/17  Gilda CreasePollina, Christopher J, MD    Family History Family History  Problem Relation Age of Onset  . Diabetes Mother     Social History Social History   Tobacco Use  . Smoking status: Current Every Day Smoker    Packs/day: 0.25  . Smokeless tobacco: Never Used  Substance Use Topics  . Alcohol use: No  . Drug use: No     Allergies   Patient has no known allergies.   Review of Systems Review of Systems  Skin: Positive for wound.  All other systems reviewed and are negative.    Physical  Exam Updated Vital Signs BP 115/71   Pulse 83   Temp 97.9 F (36.6 C) (Oral)   Resp 18   Ht 5\' 9"  (1.753 m)   Wt 104.3 kg (230 lb)   LMP 01/12/2017   SpO2 100%   BMI 33.97 kg/m   Physical Exam  Constitutional: She is oriented to person, place, and time. She appears well-developed and well-nourished.  HENT:  Head: Normocephalic.  Eyes: Pupils are equal, round, and reactive to light.  Pulmonary/Chest: Effort normal.  Musculoskeletal:       Right elbow: She exhibits normal range of motion, no swelling, no effusion and no deformity. No tenderness found.       Right forearm: She exhibits tenderness.  Neurological: She is alert and oriented to person, place, and time.  Skin: There is erythema.  0.5 cm slightly raised papule extensor aspect of right forearm approximately 8 cm from elbow, mild surrounding erythema and induration, no fluctuance     ED Treatments / Results  Labs (all labs ordered are listed, but only abnormal results are displayed) Labs Reviewed - No data to display  EKG  EKG Interpretation None       Radiology No results  found.  Procedures Procedures (including critical care time)  Medications Ordered in ED Medications  sulfamethoxazole-trimethoprim (BACTRIM DS,SEPTRA DS) 800-160 MG per tablet 1 tablet (1 tablet Oral Given 01/26/17 0353)  traMADol (ULTRAM) tablet 50 mg (50 mg Oral Given 01/26/17 0354)  ibuprofen (ADVIL,MOTRIN) tablet 800 mg (800 mg Oral Given 01/26/17 0354)     Initial Impression / Assessment and Plan / ED Course  I have reviewed the triage vital signs and the nursing notes.  Pertinent labs & imaging results that were available during my care of the patient were reviewed by me and considered in my medical decision making (see chart for details).     Patient presents with early abscess and associated circumscribed cellulitis of the right forearm.  No joint involvement.  No fluctuance to suggest need for incision and drainage at  this time.  Will initiate antibiotic coverage, patient counseled to return to the ER if the area continues to worsen, she might need incision and drainage in the future.  Final Clinical Impressions(s) / ED Diagnoses   Final diagnoses:  Abscess  Cellulitis of right upper extremity    ED Discharge Orders        Ordered    sulfamethoxazole-trimethoprim (BACTRIM DS,SEPTRA DS) 800-160 MG tablet  2 times daily     01/26/17 0349       Gilda CreasePollina, Christopher J, MD 01/26/17 [phone]

## 2017-01-26 NOTE — ED Triage Notes (Signed)
The pt has an abscess on her rt elbow area that started 5 days ago  She has attempted to pop it x 2.  Today there is increased pain and redness.  lmp  2weeks

## 2018-03-13 ENCOUNTER — Encounter (HOSPITAL_COMMUNITY): Payer: Self-pay

## 2018-03-13 ENCOUNTER — Emergency Department (HOSPITAL_COMMUNITY)
Admission: EM | Admit: 2018-03-13 | Discharge: 2018-03-13 | Disposition: A | Discharge status: Home / Self Care | Payer: Self-pay | Attending: Emergency Medicine | Admitting: Emergency Medicine

## 2018-03-13 DIAGNOSIS — F17210 Nicotine dependence, cigarettes, uncomplicated: Secondary | ICD-10-CM | POA: Insufficient documentation

## 2018-03-13 DIAGNOSIS — K0889 Other specified disorders of teeth and supporting structures: Secondary | ICD-10-CM | POA: Insufficient documentation

## 2018-03-13 MED ORDER — IBUPROFEN 800 MG PO TABS: 800.0000 mg | ORAL_TABLET | Freq: Three times a day (TID) | ORAL | 0 refills | Status: DC

## 2018-03-13 MED ORDER — PENICILLIN V POTASSIUM 500 MG PO TABS: 500.0000 mg | ORAL_TABLET | Freq: Three times a day (TID) | ORAL | 0 refills | Status: DC

## 2018-03-13 NOTE — ED Provider Notes (Signed)
[facility] EMERGENCY DEPARTMENT Provider Note   CSN: [id number] Arrival date & time: 03/13/18  1330     History   Chief Complaint Toothache  HPI Mercedes Riley is a 33 y.o. female.  HPI Patient complains of a toothache.  She states she was told several years ago that she had impacted wisdom teeth and needed to have them removed.  Patient states intermittently she has had some discomfort off and on over the years but over the last week or so she has had persistent pain in her right lower jaw.  She feels like it is more swollen now and it is hard to open her mouth.  She is scheduled to see a dentist next week but was worried she was developing an infection.  She denies any fevers or chills.  No difficulty swallowing. Past Medical History:  Diagnosis Date  . Chlamydia   . PCOS (polycystic ovarian syndrome)     Patient Active Problem List   Diagnosis Date Noted  . Pregnancy 02/23/2014  . Post-term pregnancy, 40-42 weeks of gestation     Past Surgical History:  Procedure Laterality Date  . DILATION AND CURETTAGE OF UTERUS       OB History    Gravida  2   Para  1   Term  1   Preterm      AB  1   Living  1     SAB  1   TAB      Ectopic      Multiple  0   Live Births  1            Home Medications    Prior to Admission medications   Medication Sig Start Date End Date Taking? Authorizing Provider  ibuprofen (ADVIL,MOTRIN) 800 MG tablet Take 1 tablet (800 mg total) by mouth 3 (three) times daily. 03/13/18   Linwood Dibbles, MD  metroNIDAZOLE (FLAGYL) 500 MG tablet Take 1 tablet (500 mg total) by mouth 2 (two) times daily. 02/12/16   Karim-Rhoades, Kae Heller, CNM  penicillin v potassium (VEETID) 500 MG tablet Take 1 tablet (500 mg total) by mouth 3 (three) times daily. 03/13/18   Linwood Dibbles, MD    Family History Family History  Problem Relation Age of Onset  . Diabetes Mother     Social History Social History   Tobacco Use  . Smoking  status: Current Every Day Smoker    Packs/day: 0.25  . Smokeless tobacco: Never Used  Substance Use Topics  . Alcohol use: No  . Drug use: No     Allergies   Patient has no known allergies.   Review of Systems Review of Systems  All other systems reviewed and are negative.    Physical Exam Updated Vital Signs BP 115/70   Pulse 82   Temp 98.6 F (37 C)   Resp 16   SpO2 98%   Physical Exam Vitals signs and nursing note reviewed.  Constitutional:      General: She is not in acute distress.    Appearance: She is well-developed.  HENT:     Head: Normocephalic and atraumatic.     Right Ear: External ear normal.     Left Ear: External ear normal.     Mouth/Throat:      Comments: Gingival erythema around the right lower posterior molar region, tenderness to palpation, no discrete swelling Eyes:     General: No scleral icterus.  Right eye: No discharge.        Left eye: No discharge.     Conjunctiva/sclera: Conjunctivae normal.  Neck:     Musculoskeletal: Neck supple.     Trachea: No tracheal deviation.  Cardiovascular:     Rate and Rhythm: Normal rate.  Pulmonary:     Effort: Pulmonary effort is normal. No respiratory distress.     Breath sounds: No stridor.  Abdominal:     General: There is no distension.  Musculoskeletal:        General: No swelling or deformity.  Lymphadenopathy:     Comments: Submandibular lymphadenopathy.  Skin:    General: Skin is warm and dry.     Findings: No rash.  Neurological:     Mental Status: She is alert.     Cranial Nerves: Cranial nerve deficit: no gross deficits.      ED Treatments / Results   Procedures Procedures (including critical care time)  Medications Ordered in ED Medications - No data to display   Initial Impression / Assessment and Plan / ED Course  I have reviewed the triage vital signs and the nursing notes.  Pertinent labs & imaging results that were available during my care of the patient  were reviewed by me and considered in my medical decision making (see chart for details).    Symptoms are consistent with a possible tooth infection versus impacted wisdom teeth. There are no obvious signs of a severe abscess. SHe has no significant facial swelling. At this time there does not appear to be any evidence of an emergency medical condition.  Patient has plans to see a dentist.. Patient will be given prescriptions  for pain medications and antibiotics.   Final Clinical Impressions(s) / ED Diagnoses   Final diagnoses:  Toothache    ED Discharge Orders         Ordered    ibuprofen (ADVIL,MOTRIN) 800 MG tablet  3 times daily     03/13/18 1420    penicillin v potassium (VEETID) 500 MG tablet  3 times daily     03/13/18 1420           Linwood DibblesKnapp, Halia Franey, MD 03/13/18 1424

## 2018-03-13 NOTE — ED Notes (Signed)
Pt verbalized understanding of discharge paperwork, prescriptions and follow-up care 

## 2018-03-13 NOTE — Discharge Instructions (Addendum)
Take the medications as prescribed, follow-up with your dentist as soon as possible

## 2018-03-13 NOTE — ED Triage Notes (Signed)
Patient complains of right sided gum/tooth pain. States that she thinks related to her wisdom teeth coming in, NAD

## 2019-01-26 ENCOUNTER — Encounter (HOSPITAL_COMMUNITY): Payer: Self-pay | Admitting: *Deleted

## 2019-01-26 ENCOUNTER — Other Ambulatory Visit: Payer: Self-pay

## 2019-01-26 ENCOUNTER — Emergency Department (HOSPITAL_COMMUNITY)
Admission: EM | Admit: 2019-01-26 | Discharge: 2019-01-26 | Disposition: A | Discharge status: Home / Self Care | Payer: Self-pay | Attending: Emergency Medicine | Admitting: Emergency Medicine

## 2019-01-26 ENCOUNTER — Emergency Department (HOSPITAL_COMMUNITY): Payer: Self-pay

## 2019-01-26 DIAGNOSIS — M5431 Sciatica, right side: Secondary | ICD-10-CM | POA: Insufficient documentation

## 2019-01-26 DIAGNOSIS — F17210 Nicotine dependence, cigarettes, uncomplicated: Secondary | ICD-10-CM | POA: Insufficient documentation

## 2019-01-26 DIAGNOSIS — Z79899 Other long term (current) drug therapy: Secondary | ICD-10-CM | POA: Insufficient documentation

## 2019-01-26 DIAGNOSIS — M5432 Sciatica, left side: Secondary | ICD-10-CM | POA: Insufficient documentation

## 2019-01-26 MED ORDER — CYCLOBENZAPRINE HCL 10 MG PO TABS: 10.0000 mg | ORAL_TABLET | Freq: Every day | ORAL | 0 refills | Status: DC

## 2019-01-26 MED ORDER — TRAMADOL HCL 50 MG PO TABS: 50.0000 mg | ORAL_TABLET | Freq: Four times a day (QID) | ORAL | 0 refills | Status: DC | PRN

## 2019-01-26 MED ORDER — KETOROLAC TROMETHAMINE 60 MG/2ML IM SOLN
60.0000 mg | Freq: Once | INTRAMUSCULAR | Status: AC
  Administered 2019-01-26: 60 mg via INTRAMUSCULAR
  Filled 2019-01-26: qty 2

## 2019-01-26 MED ORDER — PREDNISONE 50 MG PO TABS: 50.0000 mg | ORAL_TABLET | Freq: Every day | ORAL | 0 refills | Status: DC

## 2019-01-26 NOTE — Discharge Instructions (Addendum)
Return here as needed.  Follow-up with the specialist provided if you are not improving over the next 7 to 14 days.  Use ice and heat on your lower back.  Avoid any strenuous activity especially bending twisting and lifting heavy objects.  Your x-rays did not show any significant abnormality

## 2019-01-26 NOTE — ED Provider Notes (Signed)
[facility] EMERGENCY DEPARTMENT Provider Note   CSN: [id number] Arrival date & time: 01/26/19  9038     History Chief Complaint  Patient presents with  . Back Pain    Mercedes Riley is a 33 y.o. female.  HPI Patient presents to the emergency department with lower back pain that started 2 weeks ago.  The patient states that radiates down both legs.  The patient states that certain movements and palpation make the pain worse.  She states laying and standing make the pain better.  Patient states that she has no other issues such as numbness or weakness in the legs.  The patient denies chest pain, shortness of breath, headache,blurred vision, neck pain, fever, cough, weakness, numbness, dizziness, anorexia, edema, abdominal pain, nausea, vomiting, diarrhea, rash, dysuria, hematemesis, bloody stool, near syncope, or syncope.    Past Medical History:  Diagnosis Date  . Chlamydia   . PCOS (polycystic ovarian syndrome)     Patient Active Problem List   Diagnosis Date Noted  . Pregnancy 02/23/2014  . Post-term pregnancy, 40-42 weeks of gestation     Past Surgical History:  Procedure Laterality Date  . DILATION AND CURETTAGE OF UTERUS       OB History    Gravida  2   Para  1   Term  1   Preterm      AB  1   Living  1     SAB  1   TAB      Ectopic      Multiple  0   Live Births  1           Family History  Problem Relation Age of Onset  . Diabetes Mother     Social History   Tobacco Use  . Smoking status: Current Every Day Smoker    Packs/day: 0.25  . Smokeless tobacco: Never Used  Substance Use Topics  . Alcohol use: Yes  . Drug use: No    Home Medications Prior to Admission medications   Medication Sig Start Date End Date Taking? Authorizing Provider  ibuprofen (ADVIL,MOTRIN) 800 MG tablet Take 1 tablet (800 mg total) by mouth 3 (three) times daily. 03/13/18   Linwood Dibbles, MD  metroNIDAZOLE (FLAGYL) 500 MG tablet Take 1  tablet (500 mg total) by mouth 2 (two) times daily. 02/12/16   Karim-Rhoades, Kae Heller, CNM  penicillin v potassium (VEETID) 500 MG tablet Take 1 tablet (500 mg total) by mouth 3 (three) times daily. 03/13/18   Linwood Dibbles, MD    Allergies    Patient has no known allergies.  Review of Systems   Review of Systems All other systems negative except as documented in the HPI. All pertinent positives and negatives as reviewed in the HPI. Physical Exam Updated Vital Signs BP 112/79 (BP Location: Right Arm)   Pulse 71   Temp 98.3 F (36.8 C) (Oral)   Resp 16   Ht 5\' 9"  (1.753 m)   Wt 106.6 kg   SpO2 99%   BMI 34.70 kg/m   Physical Exam Vitals and nursing note reviewed.  Constitutional:      General: She is not in acute distress.    Appearance: She is well-developed.  HENT:     Head: Normocephalic and atraumatic.  Eyes:     Pupils: Pupils are equal, round, and reactive to light.  Pulmonary:     Effort: Pulmonary effort is normal.  Musculoskeletal:  Back:  Skin:    General: Skin is warm and dry.  Neurological:     Mental Status: She is alert and oriented to person, place, and time.     Sensory: No sensory deficit.     Motor: No weakness.     Coordination: Coordination normal.     Gait: Gait normal.     Deep Tendon Reflexes: Reflexes normal.     ED Results / Procedures / Treatments   Labs (all labs ordered are listed, but only abnormal results are displayed) Labs Reviewed - No data to display  EKG None  Radiology DG Lumbar Spine Complete  Result Date: 01/26/2019 CLINICAL DATA:  Low back pain EXAM: LUMBAR SPINE - COMPLETE 4+ VIEW COMPARISON:  None. FINDINGS: Frontal, lateral, spot lumbosacral lateral, and bilateral oblique views were obtained. There are 5 non-rib-bearing lumbar type vertebral bodies. There is slight lumbar dextroscoliosis. There is no fracture or spondylolisthesis. The disc spaces appear unremarkable. There is no appreciable facet arthropathy.  IMPRESSION: Slight lumbar dextroscoliosis. No fracture or spondylolisthesis. No appreciable arthropathy. Electronically Signed   By: Lowella Grip III M.D.   On: 01/26/2019 10:05    Procedures Procedures (including critical care time)  Medications Ordered in ED Medications - No data to display  ED Course  I have reviewed the triage vital signs and the nursing notes.  Pertinent labs & imaging results that were available during my care of the patient were reviewed by me and considered in my medical decision making (see chart for details).    MDM Rules/Calculators/A&P                     Patient does not have any neurological deficits noted on examination.  Patient will be treated for sciatica.  Patient has no significant findings on examination that would lead me to believe she has an emergent need for MRI.  We will give her a referral to neurosurgery if her symptoms are not improving. Final Clinical Impression(s) / ED Diagnoses Final diagnoses:  None    Rx / DC Orders ED Discharge Orders    None       Dalia Heading, PA-C 01/26/19 1020    Daleen Bo, MD 01/26/19 1711

## 2019-01-26 NOTE — ED Notes (Signed)
Patient transported to X-ray 

## 2019-01-26 NOTE — ED Notes (Signed)
Pt discharge instructions and prescriptions reviewed with the patient. The patient verbalized understanding of both. Pt discharged. 

## 2019-01-26 NOTE — ED Triage Notes (Signed)
C/o back pain lower onset 2 weeks ago , states the pain radiates to both thighs.

## 2019-03-12 ENCOUNTER — Ambulatory Visit: Payer: Self-pay | Attending: Internal Medicine

## 2019-03-12 DIAGNOSIS — Z20822 Contact with and (suspected) exposure to covid-19: Secondary | ICD-10-CM | POA: Insufficient documentation

## 2019-03-13 LAB — NOVEL CORONAVIRUS, NAA: SARS-CoV-2, NAA: NOT DETECTED

## 2019-06-06 DEATH — deceased

## 2020-09-15 LAB — OB RESULTS CONSOLE ABO/RH: RH Type: POSITIVE

## 2020-09-15 LAB — OB RESULTS CONSOLE ANTIBODY SCREEN: Antibody Screen: NEGATIVE

## 2020-09-15 LAB — OB RESULTS CONSOLE RUBELLA ANTIBODY, IGM: Rubella: IMMUNE

## 2020-09-15 LAB — OB RESULTS CONSOLE HIV ANTIBODY (ROUTINE TESTING): HIV: NONREACTIVE

## 2020-09-15 LAB — OB RESULTS CONSOLE GC/CHLAMYDIA
Chlamydia: NEGATIVE
Gonorrhea: NEGATIVE

## 2020-09-15 LAB — OB RESULTS CONSOLE RPR: RPR: NONREACTIVE

## 2020-09-15 LAB — HEPATITIS C ANTIBODY: HCV Ab: NEGATIVE

## 2020-09-15 LAB — OB RESULTS CONSOLE HEPATITIS B SURFACE ANTIGEN: Hepatitis B Surface Ag: NEGATIVE

## 2021-02-05 NOTE — L&D Delivery Note (Signed)
Delivery Note ?Patient labored down for 1 hour, at which time fetal station had improved to 0.  She pushed well for 20 minutes.  At 5:55 AM a viable female was delivered via Vaginal, Spontaneous (Presentation: Left Occiput Anterior).  APGAR: 8, 9; weight 4220 gm (9lb 4.9 oz) .   ?Placenta status: Spontaneous, Intact.  Cord: 3 vessels with the following complications: Short.  Cord pH: n/a ? ?There was a second degree perineal laceration.  There was a superficial skin tear that extended to the anus.  DRE was performed prior to start of repair which confirmed external sphincter was intact.   Following repair, DRE was again performed which demonstrated normal rectal tone an intact sphincter ? ?Anesthesia: Epidural ?Episiotomy: None ?Lacerations: 2nd degree ?Suture Repair: 2.0 vicryl rapide ?Est. Blood Loss (mL):  150 mL  ? ? ? ?Mom to postpartum.  Baby to Couplet care / Skin to Skin. ? ?Mercedes Riley Mercedes Riley ?05/04/2021, 6:29 AM ? ? ? ?

## 2021-03-28 ENCOUNTER — Encounter (HOSPITAL_COMMUNITY): Payer: Self-pay | Admitting: Obstetrics and Gynecology

## 2021-03-28 ENCOUNTER — Other Ambulatory Visit: Payer: Self-pay

## 2021-03-28 ENCOUNTER — Inpatient Hospital Stay (HOSPITAL_COMMUNITY)
Admission: AD | Admit: 2021-03-28 | Discharge: 2021-03-28 | Disposition: A | Discharge status: Home / Self Care | Payer: BC Managed Care – PPO | Attending: Obstetrics and Gynecology | Admitting: Obstetrics and Gynecology

## 2021-03-28 DIAGNOSIS — O368130 Decreased fetal movements, third trimester, not applicable or unspecified: Secondary | ICD-10-CM | POA: Insufficient documentation

## 2021-03-28 DIAGNOSIS — U071 COVID-19: Secondary | ICD-10-CM | POA: Diagnosis not present

## 2021-03-28 DIAGNOSIS — Z3A35 35 weeks gestation of pregnancy: Secondary | ICD-10-CM | POA: Insufficient documentation

## 2021-03-28 DIAGNOSIS — O09523 Supervision of elderly multigravida, third trimester: Secondary | ICD-10-CM | POA: Insufficient documentation

## 2021-03-28 DIAGNOSIS — O98513 Other viral diseases complicating pregnancy, third trimester: Secondary | ICD-10-CM | POA: Diagnosis present

## 2021-03-28 LAB — CBC WITH DIFFERENTIAL/PLATELET
Abs Immature Granulocytes: 0.16 10*3/uL — ABNORMAL HIGH (ref 0.00–0.07)
Basophils Absolute: 0.1 10*3/uL (ref 0.0–0.1)
Basophils Relative: 0 %
Eosinophils Absolute: 0 10*3/uL (ref 0.0–0.5)
Eosinophils Relative: 0 %
HCT: 31.2 % — ABNORMAL LOW (ref 36.0–46.0)
Hemoglobin: 10.1 g/dL — ABNORMAL LOW (ref 12.0–15.0)
Immature Granulocytes: 1 %
Lymphocytes Relative: 4 %
Lymphs Abs: 0.4 10*3/uL — ABNORMAL LOW (ref 0.7–4.0)
MCH: 31.3 pg (ref 26.0–34.0)
MCHC: 32.4 g/dL (ref 30.0–36.0)
MCV: 96.6 fL (ref 80.0–100.0)
Monocytes Absolute: 1.2 10*3/uL — ABNORMAL HIGH (ref 0.1–1.0)
Monocytes Relative: 10 %
Neutro Abs: 10.3 10*3/uL — ABNORMAL HIGH (ref 1.7–7.7)
Neutrophils Relative %: 85 %
Platelets: 222 10*3/uL (ref 150–400)
RBC: 3.23 MIL/uL — ABNORMAL LOW (ref 3.87–5.11)
RDW: 13.3 % (ref 11.5–15.5)
WBC: 12.1 10*3/uL — ABNORMAL HIGH (ref 4.0–10.5)
nRBC: 0.2 % (ref 0.0–0.2)

## 2021-03-28 LAB — RESP PANEL BY RT-PCR (FLU A&B, COVID) ARPGX2
Influenza A by PCR: NEGATIVE
Influenza B by PCR: NEGATIVE
SARS Coronavirus 2 by RT PCR: POSITIVE — AB

## 2021-03-28 LAB — COMPREHENSIVE METABOLIC PANEL
ALT: 22 U/L (ref 0–44)
AST: 25 U/L (ref 15–41)
Albumin: 2.9 g/dL — ABNORMAL LOW (ref 3.5–5.0)
Alkaline Phosphatase: 60 U/L (ref 38–126)
Anion gap: 10 (ref 5–15)
BUN: 5 mg/dL — ABNORMAL LOW (ref 6–20)
CO2: 20 mmol/L — ABNORMAL LOW (ref 22–32)
Calcium: 8.5 mg/dL — ABNORMAL LOW (ref 8.9–10.3)
Chloride: 102 mmol/L (ref 98–111)
Creatinine, Ser: 0.66 mg/dL (ref 0.44–1.00)
GFR, Estimated: >60 mL/min (ref >60)
Glucose, Bld: 87 mg/dL (ref 70–99)
Potassium: 3.7 mmol/L (ref 3.5–5.1)
Sodium: 132 mmol/L — ABNORMAL LOW (ref 135–145)
Total Bilirubin: 0.4 mg/dL (ref 0.3–1.2)
Total Protein: 6.5 g/dL (ref 6.5–8.1)

## 2021-03-28 LAB — URINALYSIS, ROUTINE W REFLEX MICROSCOPIC
Bilirubin Urine: NEGATIVE
Glucose, UA: NEGATIVE mg/dL
Hgb urine dipstick: NEGATIVE
Ketones, ur: NEGATIVE mg/dL
Nitrite: NEGATIVE
Protein, ur: 100 mg/dL — AB
Specific Gravity, Urine: 1.023 (ref 1.005–1.030)
pH: 7 (ref 5.0–8.0)

## 2021-03-28 LAB — LACTIC ACID, PLASMA: Lactic Acid, Venous: 0.9 mmol/L (ref 0.5–1.9)

## 2021-03-28 LAB — BRAIN NATRIURETIC PEPTIDE: B Natriuretic Peptide: 27.7 pg/mL (ref 0.0–100.0)

## 2021-03-28 MED ORDER — LACTATED RINGERS IV BOLUS
1000.0000 mL | Freq: Once | INTRAVENOUS | Status: AC
  Administered 2021-03-28: 1000 mL via INTRAVENOUS

## 2021-03-28 MED ORDER — ONDANSETRON HCL 4 MG/2ML IJ SOLN
4.0000 mg | Freq: Once | INTRAMUSCULAR | Status: AC
  Administered 2021-03-28: 4 mg via INTRAVENOUS
  Filled 2021-03-28: qty 2

## 2021-03-28 MED ORDER — NIRMATRELVIR/RITONAVIR (PAXLOVID)TABLET: 3.0000 | ORAL_TABLET | Freq: Two times a day (BID) | ORAL | 0 refills | Status: AC

## 2021-03-28 MED ORDER — FAMOTIDINE IN NACL 20-0.9 MG/50ML-% IV SOLN
20.0000 mg | Freq: Once | INTRAVENOUS | Status: AC
  Administered 2021-03-28: 20 mg via INTRAVENOUS
  Filled 2021-03-28: qty 50

## 2021-03-28 MED ORDER — ACETAMINOPHEN 500 MG PO TABS
1000.0000 mg | ORAL_TABLET | Freq: Once | ORAL | Status: AC
  Administered 2021-03-28: 1000 mg via ORAL
  Filled 2021-03-28: qty 2

## 2021-03-28 MED ORDER — ONDANSETRON HCL 8 MG PO TABS: 8.0000 mg | ORAL_TABLET | Freq: Three times a day (TID) | ORAL | 0 refills | Status: DC | PRN

## 2021-03-28 NOTE — MAU Note (Signed)
Presents with c/o chills, body aches, H/A , N/V, dizziness and decreased FM.  Reports flu like symptoms and N/V began this morning.  Reports has vomited 3x in 24 hours.   Denies VB or LOF.  Reports has felt movement but less than usual.

## 2021-03-28 NOTE — Discharge Instructions (Signed)

## 2021-03-28 NOTE — MAU Provider Note (Signed)
History     CSN: [id number]  Arrival date and time: 03/28/21 1741   Event Date/Time   First Provider Initiated Contact with Patient 03/28/21 1915      Chief Complaint  Patient presents with   Bodyaches   Headache   Chills   Decreased Fetal Movement   HPI Mercedes Riley is a 36 y.o. G3P1011 at [redacted]w[redacted]d who presents with chills, body aches, headache, nausea, vomiting, dizziness and decreased fetal movement. She states they symptoms started suddenly today. She denies any sick contacts. She is unsure if she was febrile at home. She denies any shortness of breath or chest pain. She denies abdominal pain, vaginal bleeding or discharge. She reports the movements are normal since her arrival to MAU.   She has not had any COVID vaccines.  OB History     Gravida  3   Para  1   Term  1   Preterm      AB  1   Living  1      SAB  1   IAB      Ectopic      Multiple  0   Live Births  1           Past Medical History:  Diagnosis Date   Chlamydia    PCOS (polycystic ovarian syndrome)     Past Surgical History:  Procedure Laterality Date   DILATION AND CURETTAGE OF UTERUS      Family History  Problem Relation Age of Onset   Diabetes Mother     Social History   Tobacco Use   Smoking status: Former    Packs/day: 0.25    Types: Cigarettes   Smokeless tobacco: Never  Substance Use Topics   Alcohol use: Not Currently   Drug use: No    Allergies: No Known Allergies  Medications Prior to Admission  Medication Sig Dispense Refill Last Dose   cyclobenzaprine (FLEXERIL) 10 MG tablet Take 1 tablet (10 mg total) by mouth at bedtime. 15 tablet 0    ibuprofen (ADVIL,MOTRIN) 800 MG tablet Take 1 tablet (800 mg total) by mouth 3 (three) times daily. 21 tablet 0    metroNIDAZOLE (FLAGYL) 500 MG tablet Take 1 tablet (500 mg total) by mouth 2 (two) times daily. 14 tablet 0    penicillin v potassium (VEETID) 500 MG tablet Take 1 tablet (500 mg total) by mouth 3  (three) times daily. 21 tablet 0    predniSONE (DELTASONE) 50 MG tablet Take 1 tablet (50 mg total) by mouth daily. 5 tablet 0    traMADol (ULTRAM) 50 MG tablet Take 1 tablet (50 mg total) by mouth every 6 (six) hours as needed for severe pain. 15 tablet 0     Review of Systems  Constitutional:  Positive for chills, fatigue and fever.  HENT:  Positive for congestion.   Respiratory:  Positive for cough. Negative for shortness of breath.   Cardiovascular: Negative.  Negative for chest pain.  Gastrointestinal:  Positive for nausea. Negative for abdominal pain, constipation, diarrhea and vomiting.  Genitourinary: Negative.  Negative for dysuria, vaginal bleeding and vaginal discharge.  Neurological: Negative.  Negative for dizziness and headaches.  Physical Exam   Blood pressure (!) 92/46, pulse (!) 103, temperature (!) 101.6 F (38.7 C), temperature source Oral, resp. rate 20, height 5\' 9"  (1.753 m), weight 118.4 kg, SpO2 98 %, unknown if currently breastfeeding.  Patient Vitals for the past 24 hrs:  BP Temp Temp src  Pulse Resp SpO2 Height Weight  03/28/21 2140 (!) 110/47 99 F (37.2 C) Axillary (!) 110 19 97 % -- --  03/28/21 1815 (!) 92/46 (!) 101.6 F (38.7 C) Oral (!) 103 20 98 % -- --  03/28/21 1809 -- -- -- -- -- -- 5\' 9"  (1.753 m) 118.4 kg     Physical Exam Vitals and nursing note reviewed.  Constitutional:      General: She is not in acute distress.    Appearance: She is well-developed.  HENT:     Head: Normocephalic.  Eyes:     Pupils: Pupils are equal, round, and reactive to light.  Cardiovascular:     Rate and Rhythm: Normal rate and regular rhythm.     Heart sounds: Normal heart sounds.  Pulmonary:     Effort: Pulmonary effort is normal. No respiratory distress.     Breath sounds: Normal breath sounds.  Abdominal:     General: Bowel sounds are normal. There is no distension.     Palpations: Abdomen is soft.     Tenderness: There is no abdominal tenderness.   Skin:    General: Skin is warm and dry.  Neurological:     Mental Status: She is alert and oriented to person, place, and time.  Psychiatric:        Mood and Affect: Mood normal.        Behavior: Behavior normal.        Thought Content: Thought content normal.        Judgment: Judgment normal.   Fetal Tracing:  Baseline: 150 Variability: moderate Accels: 15x15 Decels: none  Toco: none   MAU Course  Procedures Results for orders placed or performed during the hospital encounter of 03/28/21 (from the past 24 hour(s))  Resp Panel by RT-PCR (Flu A&B, Covid) Nasopharyngeal Swab     Status: Abnormal   Collection Time: 03/28/21  6:24 PM   Specimen: Nasopharyngeal Swab; Nasopharyngeal(NP) swabs in vial transport medium  Result Value Ref Range   SARS Coronavirus 2 by RT PCR POSITIVE (A) NEGATIVE   Influenza A by PCR NEGATIVE NEGATIVE   Influenza B by PCR NEGATIVE NEGATIVE  Urinalysis, Routine w reflex microscopic     Status: Abnormal   Collection Time: 03/28/21  8:05 PM  Result Value Ref Range   Color, Urine AMBER (A) YELLOW   APPearance HAZY (A) CLEAR   Specific Gravity, Urine 1.023 1.005 - 1.030   pH 7.0 5.0 - 8.0   Glucose, UA NEGATIVE NEGATIVE mg/dL   Hgb urine dipstick NEGATIVE NEGATIVE   Bilirubin Urine NEGATIVE NEGATIVE   Ketones, ur NEGATIVE NEGATIVE mg/dL   Protein, ur 100 (A) NEGATIVE mg/dL   Nitrite NEGATIVE NEGATIVE   Leukocytes,Ua TRACE (A) NEGATIVE   RBC / HPF 0-5 0 - 5 RBC/hpf   WBC, UA 6-10 0 - 5 WBC/hpf   Bacteria, UA FEW (A) NONE SEEN   Squamous Epithelial / LPF 11-20 0 - 5   Mucus PRESENT   CBC with Differential/Platelet     Status: Abnormal   Collection Time: 03/28/21  8:05 PM  Result Value Ref Range   WBC 12.1 (H) 4.0 - 10.5 K/uL   RBC 3.23 (L) 3.87 - 5.11 MIL/uL   Hemoglobin 10.1 (L) 12.0 - 15.0 g/dL   HCT 31.2 (L) 36.0 - 46.0 %   MCV 96.6 80.0 - 100.0 fL   MCH 31.3 26.0 - 34.0 pg   MCHC 32.4 30.0 - 36.0 g/dL   RDW 13.3  11.5 - 15.5 %    Platelets 222 150 - 400 K/uL   nRBC 0.2 0.0 - 0.2 %   Neutrophils Relative % 85 %   Neutro Abs 10.3 (H) 1.7 - 7.7 K/uL   Lymphocytes Relative 4 %   Lymphs Abs 0.4 (L) 0.7 - 4.0 K/uL   Monocytes Relative 10 %   Monocytes Absolute 1.2 (H) 0.1 - 1.0 K/uL   Eosinophils Relative 0 %   Eosinophils Absolute 0.0 0.0 - 0.5 K/uL   Basophils Relative 0 %   Basophils Absolute 0.1 0.0 - 0.1 K/uL   Immature Granulocytes 1 %   Abs Immature Granulocytes 0.16 (H) 0.00 - 0.07 K/uL  Lactic acid, plasma     Status: None   Collection Time: 03/28/21  8:05 PM  Result Value Ref Range   Lactic Acid, Venous 0.9 0.5 - 1.9 mmol/L  Comprehensive metabolic panel     Status: Abnormal   Collection Time: 03/28/21  8:05 PM  Result Value Ref Range   Sodium 132 (L) 135 - 145 mmol/L   Potassium 3.7 3.5 - 5.1 mmol/L   Chloride 102 98 - 111 mmol/L   CO2 20 (L) 22 - 32 mmol/L   Glucose, Bld 87 70 - 99 mg/dL   BUN 5 (L) 6 - 20 mg/dL   Creatinine, Ser 0.66 0.44 - 1.00 mg/dL   Calcium 8.5 (L) 8.9 - 10.3 mg/dL   Total Protein 6.5 6.5 - 8.1 g/dL   Albumin 2.9 (L) 3.5 - 5.0 g/dL   AST 25 15 - 41 U/L   ALT 22 0 - 44 U/L   Alkaline Phosphatase 60 38 - 126 U/L   Total Bilirubin 0.4 0.3 - 1.2 mg/dL   GFR, Estimated >60 >60 mL/min   Anion gap 10 5 - 15    MDM UA Resp Panel CBC with Diff CMP Lactic acid BNP  LR bolus Pepcid Zofran Tylenol PO  Reviewed results of positive COVID test with patient. Discussed typical course of virus and what to expect. Informed patient that symptoms tend to worsen on days 5-8 and reviewed safe medications for symptom management. Discussed risks and benefits of Paxlovid in pregnancy. Patient desires RX  Warning signs of when to return to MAU reviewed at length including shortness of breath, chest pain and decreased fetal movement. Instructed patient to quarantine for 5 days and any office appointments will be changed to virtual or rescheduled to outside of the quarantine window. Dr.  Marvel Plan notified and instructed patient to call office to notify of diagnosis    Assessment and Plan   1. COVID-19 affecting pregnancy in third trimester   2. [redacted] weeks gestation of pregnancy    -Discharge home in stable condition -Rx for paxlovid and zofran sent to patient's pharmacy -COVID precautions discussed -Patient advised to follow-up with OB tomorrow to notify of diagnosis. -Patient may return to MAU as needed or if her condition were to change or worsen   Wende Mott CNM 03/28/2021, 7:15 PM

## 2021-04-11 LAB — OB RESULTS CONSOLE GBS: GBS: NEGATIVE

## 2021-04-25 ENCOUNTER — Telehealth (HOSPITAL_COMMUNITY): Payer: Self-pay | Admitting: *Deleted

## 2021-04-25 NOTE — Telephone Encounter (Signed)
Preadmission screen  

## 2021-04-27 ENCOUNTER — Encounter (HOSPITAL_COMMUNITY): Payer: Self-pay | Admitting: *Deleted

## 2021-04-27 ENCOUNTER — Telehealth (HOSPITAL_COMMUNITY): Payer: Self-pay | Admitting: *Deleted

## 2021-04-27 NOTE — Telephone Encounter (Signed)
Preadmission screen  

## 2021-04-30 ENCOUNTER — Other Ambulatory Visit: Payer: Self-pay | Admitting: Obstetrics and Gynecology

## 2021-05-03 ENCOUNTER — Other Ambulatory Visit: Payer: Self-pay | Admitting: Obstetrics and Gynecology

## 2021-05-03 NOTE — H&P (Signed)
Mercedes Riley is a 36 y.o. female G3P1011 at 102 2/7 weeks (EDD 05/02/21 by 7 week [place] inconsistent with LMP) presenting for IOL. ? ?Prenatal care significant for: ?1) Obesity  ?BMI 35 ?2) Advanced Maternal Age  ?Declined genetic screens ?3) COVID-19  ?pos 03-28-2021 in MAU ? ?OB History   ? ? Gravida  ?3  ? Para  ?1  ? Term  ?1  ? Preterm  ?   ? AB  ?1  ? Living  ?1  ?  ? ? SAB  ?1  ? IAB  ?   ? Ectopic  ?   ? Multiple  ?0  ? Live Births  ?1  ?   ?  ?  ?02-24-2014, 40 wks ?M, 9lbs 3oz, Vaginal Delivery ? ?EAB x 1 ? ?Past Medical History:  ?Diagnosis Date  ? Chlamydia   ? PCOS (polycystic ovarian syndrome)   ? ?Past Surgical History:  ?Procedure Laterality Date  ? DILATION AND CURETTAGE OF UTERUS    ? ?Family History: family history includes Diabetes in her mother. ?Social History:  reports that she has quit smoking. Her smoking use included cigarettes. She smoked an average of .25 packs per day. She has never used smokeless tobacco. She reports that she does not currently use alcohol. She reports that she does not use drugs. ? ? ?  ?Maternal Diabetes: No ?Genetic Screening: Declined ?Maternal Ultrasounds/Referrals: Normal ?Fetal Ultrasounds or other Referrals:  None ?Maternal Substance Abuse:  No ?Significant Maternal Medications:  None ?Significant Maternal Lab Results:  Group B Strep negative ?Other Comments:  None ? ?Review of Systems  ?Constitutional:  Negative for fever.  ?Gastrointestinal:  Negative for abdominal pain.  ?Maternal Medical History:  ?Contractions: Frequency: irregular.   ?Perceived severity is mild.   ? ?  ?unknown if currently breastfeeding. ?Maternal Exam:  ?Uterine Assessment: Contraction strength is mild.  Contraction frequency is irregular.  ?Abdomen: Patient reports no abdominal tenderness. Fetal presentation: vertex ?Introitus: Normal vulva. Normal vagina.   ?Physical Exam ?Cardiovascular:  ?   Rate and Rhythm: Normal rate and regular rhythm.  ?Pulmonary:  ?   Effort: Pulmonary effort is  normal.  ?Abdominal:  ?   Palpations: Abdomen is soft.  ?Genitourinary: ?   General: Normal vulva.  ?Neurological:  ?   Mental Status: She is alert.  ?Psychiatric:     ?   Mood and Affect: Mood normal.  ?  ?Prenatal labs: ?ABO, Rh: O/Positive/-- (08/11 0000) ?Antibody: Negative (08/11 0000) ?Rubella: Immune (08/11 0000) ?RPR: Nonreactive (08/11 0000)  ?HBsAg: Negative (08/11 0000)  ?HIV: Non-reactive (08/11 0000)  ?GBS: Negative/-- (03/07 0000)  ?One hour GCT 129 ?Hgb AA ?Assessment/Plan: ?Pt for IOL at term.  Epidural prn. Plan pitocin and AROM.  Pelvis tested to 9#3oz, this baby likely same size.  ? ?Oliver Pila ?05/03/2021, 10:11 PM ? ? ? ? ?

## 2021-05-04 ENCOUNTER — Inpatient Hospital Stay (HOSPITAL_COMMUNITY): Payer: BC Managed Care – PPO | Admitting: Anesthesiology

## 2021-05-04 ENCOUNTER — Inpatient Hospital Stay (HOSPITAL_COMMUNITY)
Admission: AD | Admit: 2021-05-04 | Payer: BC Managed Care – PPO | Source: Home / Self Care | Admitting: Obstetrics and Gynecology

## 2021-05-04 ENCOUNTER — Encounter (HOSPITAL_COMMUNITY): Payer: Self-pay | Admitting: Obstetrics and Gynecology

## 2021-05-04 ENCOUNTER — Inpatient Hospital Stay (HOSPITAL_COMMUNITY)
Admission: AD | Admit: 2021-05-04 | Discharge: 2021-05-05 | DRG: 807 | Disposition: A | Discharge status: Home / Self Care | Payer: BC Managed Care – PPO | Attending: Obstetrics | Admitting: Obstetrics

## 2021-05-04 ENCOUNTER — Inpatient Hospital Stay (HOSPITAL_COMMUNITY): Payer: BC Managed Care – PPO

## 2021-05-04 ENCOUNTER — Other Ambulatory Visit: Payer: Self-pay

## 2021-05-04 DIAGNOSIS — O480 Post-term pregnancy: Principal | ICD-10-CM | POA: Diagnosis present

## 2021-05-04 DIAGNOSIS — Z87891 Personal history of nicotine dependence: Secondary | ICD-10-CM

## 2021-05-04 DIAGNOSIS — Z8616 Personal history of COVID-19: Secondary | ICD-10-CM

## 2021-05-04 DIAGNOSIS — Z3A40 40 weeks gestation of pregnancy: Secondary | ICD-10-CM | POA: Diagnosis not present

## 2021-05-04 DIAGNOSIS — O894 Spinal and epidural anesthesia-induced headache during the puerperium: Secondary | ICD-10-CM | POA: Diagnosis not present

## 2021-05-04 LAB — CBC
HCT: 33.5 % — ABNORMAL LOW (ref 36.0–46.0)
Hemoglobin: 11 g/dL — ABNORMAL LOW (ref 12.0–15.0)
MCH: 30.9 pg (ref 26.0–34.0)
MCHC: 32.8 g/dL (ref 30.0–36.0)
MCV: 94.1 fL (ref 80.0–100.0)
Platelets: 247 10*3/uL (ref 150–400)
RBC: 3.56 MIL/uL — ABNORMAL LOW (ref 3.87–5.11)
RDW: 13.6 % (ref 11.5–15.5)
WBC: 12.7 10*3/uL — ABNORMAL HIGH (ref 4.0–10.5)
nRBC: 0 % (ref 0.0–0.2)

## 2021-05-04 LAB — TYPE AND SCREEN
ABO/RH(D): O POS
Antibody Screen: NEGATIVE

## 2021-05-04 LAB — RPR: RPR Ser Ql: NONREACTIVE

## 2021-05-04 MED ORDER — ONDANSETRON HCL 4 MG/2ML IJ SOLN: 4.0000 mg | Freq: Four times a day (QID) | INTRAMUSCULAR | Status: DC | PRN

## 2021-05-04 MED ORDER — ACETAMINOPHEN 325 MG PO TABS: 650.0000 mg | ORAL_TABLET | ORAL | Status: DC | PRN

## 2021-05-04 MED ORDER — LIDOCAINE HCL (PF) 1 % IJ SOLN
INTRAMUSCULAR | Status: DC | PRN
  Administered 2021-05-04: 8 mL via EPIDURAL

## 2021-05-04 MED ORDER — PRENATAL MULTIVITAMIN CH
1.0000 | ORAL_TABLET | Freq: Every day | ORAL | Status: DC
  Administered 2021-05-04 – 2021-05-05 (×2): 1 via ORAL
  Filled 2021-05-04 (×2): qty 1

## 2021-05-04 MED ORDER — DIPHENHYDRAMINE HCL 25 MG PO CAPS: 25.0000 mg | ORAL_CAPSULE | Freq: Four times a day (QID) | ORAL | Status: DC | PRN

## 2021-05-04 MED ORDER — FENTANYL-BUPIVACAINE-NACL 0.5-0.125-0.9 MG/250ML-% EP SOLN: 12.0000 mL/h | EPIDURAL | Status: DC | PRN

## 2021-05-04 MED ORDER — LIDOCAINE HCL (PF) 1 % IJ SOLN
30.0000 mL | INTRAMUSCULAR | Status: AC | PRN
Start: 2021-05-04 — End: 2021-05-04
  Administered 2021-05-04: 30 mL via SUBCUTANEOUS
  Filled 2021-05-04: qty 30

## 2021-05-04 MED ORDER — EPHEDRINE 5 MG/ML INJ: 10.0000 mg | INTRAVENOUS | Status: DC | PRN

## 2021-05-04 MED ORDER — OXYCODONE-ACETAMINOPHEN 5-325 MG PO TABS: 2.0000 | ORAL_TABLET | ORAL | Status: DC | PRN

## 2021-05-04 MED ORDER — DIBUCAINE (PERIANAL) 1 % EX OINT: 1.0000 "application " | TOPICAL_OINTMENT | CUTANEOUS | Status: DC | PRN

## 2021-05-04 MED ORDER — ONDANSETRON HCL 4 MG PO TABS: 4.0000 mg | ORAL_TABLET | ORAL | Status: DC | PRN

## 2021-05-04 MED ORDER — ONDANSETRON HCL 4 MG/2ML IJ SOLN
4.0000 mg | INTRAMUSCULAR | Status: DC | PRN
Start: 2021-05-04 — End: 2021-05-06

## 2021-05-04 MED ORDER — ACETAMINOPHEN 325 MG PO TABS
650.0000 mg | ORAL_TABLET | ORAL | Status: DC | PRN
  Administered 2021-05-04: 650 mg via ORAL
  Filled 2021-05-04: qty 2

## 2021-05-04 MED ORDER — LACTATED RINGERS IV SOLN: INTRAVENOUS | Status: DC

## 2021-05-04 MED ORDER — SIMETHICONE 80 MG PO CHEW: 80.0000 mg | CHEWABLE_TABLET | ORAL | Status: DC | PRN

## 2021-05-04 MED ORDER — WITCH HAZEL-GLYCERIN EX PADS: 1.0000 "application " | MEDICATED_PAD | CUTANEOUS | Status: DC | PRN

## 2021-05-04 MED ORDER — DIPHENHYDRAMINE HCL 50 MG/ML IJ SOLN: 12.5000 mg | INTRAMUSCULAR | Status: DC | PRN

## 2021-05-04 MED ORDER — OXYCODONE HCL 5 MG PO TABS
5.0000 mg | ORAL_TABLET | ORAL | Status: DC | PRN
  Administered 2021-05-04 – 2021-05-05 (×2): 5 mg via ORAL
  Filled 2021-05-04 (×3): qty 1

## 2021-05-04 MED ORDER — FENTANYL-BUPIVACAINE-NACL 0.5-0.125-0.9 MG/250ML-% EP SOLN
12.0000 mL/h | EPIDURAL | Status: DC | PRN
  Administered 2021-05-04: 12 mL/h via EPIDURAL
  Filled 2021-05-04: qty 250

## 2021-05-04 MED ORDER — LACTATED RINGERS IV SOLN: 500.0000 mL | INTRAVENOUS | Status: DC | PRN

## 2021-05-04 MED ORDER — OXYCODONE HCL 5 MG PO TABS
10.0000 mg | ORAL_TABLET | ORAL | Status: DC | PRN
  Administered 2021-05-05: 10 mg via ORAL
  Administered 2021-05-05: 5 mg via ORAL
  Filled 2021-05-04: qty 2

## 2021-05-04 MED ORDER — BENZOCAINE-MENTHOL 20-0.5 % EX AERO
1.0000 | INHALATION_SPRAY | CUTANEOUS | Status: DC | PRN
Start: 2021-05-04 — End: 2021-05-06

## 2021-05-04 MED ORDER — SENNOSIDES-DOCUSATE SODIUM 8.6-50 MG PO TABS
2.0000 | ORAL_TABLET | ORAL | Status: DC
  Administered 2021-05-04 – 2021-05-05 (×2): 2 via ORAL
  Filled 2021-05-04 (×2): qty 2

## 2021-05-04 MED ORDER — PHENYLEPHRINE 40 MCG/ML (10ML) SYRINGE FOR IV PUSH (FOR BLOOD PRESSURE SUPPORT): 80.0000 ug | PREFILLED_SYRINGE | INTRAVENOUS | Status: DC | PRN

## 2021-05-04 MED ORDER — SOD CITRATE-CITRIC ACID 500-334 MG/5ML PO SOLN: 30.0000 mL | ORAL | Status: DC | PRN

## 2021-05-04 MED ORDER — OXYTOCIN BOLUS FROM INFUSION
333.0000 mL | Freq: Once | INTRAVENOUS | Status: AC
  Administered 2021-05-04: 333 mL via INTRAVENOUS

## 2021-05-04 MED ORDER — LACTATED RINGERS IV SOLN
500.0000 mL | Freq: Once | INTRAVENOUS | Status: AC
  Administered 2021-05-04: 500 mL via INTRAVENOUS

## 2021-05-04 MED ORDER — COCONUT OIL OIL: 1.0000 "application " | TOPICAL_OIL | Status: DC | PRN

## 2021-05-04 MED ORDER — OXYTOCIN-SODIUM CHLORIDE 30-0.9 UT/500ML-% IV SOLN
2.5000 [IU]/h | INTRAVENOUS | Status: DC
  Administered 2021-05-04: 2.5 [IU]/h via INTRAVENOUS
  Filled 2021-05-04: qty 500

## 2021-05-04 MED ORDER — IBUPROFEN 600 MG PO TABS
600.0000 mg | ORAL_TABLET | Freq: Four times a day (QID) | ORAL | Status: DC
  Administered 2021-05-04 – 2021-05-05 (×5): 600 mg via ORAL
  Filled 2021-05-04 (×6): qty 1

## 2021-05-04 MED ORDER — TETANUS-DIPHTH-ACELL PERTUSSIS 5-2.5-18.5 LF-MCG/0.5 IM SUSY: 0.5000 mL | PREFILLED_SYRINGE | Freq: Once | INTRAMUSCULAR | Status: DC

## 2021-05-04 MED ORDER — OXYCODONE-ACETAMINOPHEN 5-325 MG PO TABS: 1.0000 | ORAL_TABLET | ORAL | Status: DC | PRN

## 2021-05-04 NOTE — Anesthesia Postprocedure Evaluation (Signed)
Anesthesia Post Note ? ?Patient: Mercedes Riley ? ?Procedure(s) Performed: AN AD HOC LABOR EPIDURAL ? ?  ? ?Patient location during evaluation: Mother Baby ?Anesthesia Type: Epidural ?Level of consciousness: awake and alert and oriented ?Pain management: satisfactory to patient ?Vital Signs Assessment: post-procedure vital signs reviewed and stable ?Respiratory status: respiratory function stable ?Cardiovascular status: stable ?Postop Assessment: no headache, no backache, epidural receding, patient able to bend at knees, no signs of nausea or vomiting, adequate PO intake and able to ambulate ?Anesthetic complications: no ? ? ?No notable events documented. ? ?Last Vitals:  ?Vitals:  ? 05/04/21 0951 05/04/21 1438  ?BP: 127/81 113/79  ?Pulse: 74 73  ?Resp: 16 17  ?Temp: 36.4 ?C 36.6 ?C  ?SpO2: 100% 100%  ?  ?Last Pain:  ?Vitals:  ? 05/04/21 1613  ?TempSrc:   ?PainSc: 8   ? ?Pain Goal:   ? ?  ?  ?  ?  ?  ?  ?  ? ?Django Nguyen ? ? ? ? ?

## 2021-05-04 NOTE — Anesthesia Postprocedure Evaluation (Signed)
Anesthesia Post Note ? ?Patient: Mercedes Riley ? ?Procedure(s) Performed: AN AD HOC LABOR EPIDURAL ? ?  ? ?Patient location during evaluation: Mother Baby ?Anesthesia Type: Epidural ?Level of consciousness: awake and alert and oriented ?Pain management: satisfactory to patient ?Vital Signs Assessment: post-procedure vital signs reviewed and stable ?Respiratory status: respiratory function stable ?Cardiovascular status: stable ?Postop Assessment: no headache, no backache, epidural receding, patient able to bend at knees, no signs of nausea or vomiting, adequate PO intake and able to ambulate ?Anesthetic complications: no ? ? ?No notable events documented. ? ?Last Vitals:  ?Vitals:  ? 05/04/21 0951 05/04/21 1438  ?BP: 127/81 113/79  ?Pulse: 74 73  ?Resp: 16 17  ?Temp: 36.4 ?C 36.6 ?C  ?SpO2: 100% 100%  ?  ?Last Pain:  ?Vitals:  ? 05/04/21 1735  ?TempSrc:   ?PainSc: 6   ? ?Pain Goal:   ? ?  ?  ?  ?  ?  ?  ?  ? ?[name] ? ? ? ? ?

## 2021-05-04 NOTE — H&P (Signed)
36 y.o. [id number] @ [redacted]w[redacted]d presents with labor.  Otherwise has good fetal movement and no bleeding. ? ?Pregnancy complicated by: ?AMA: declined genetic screening ?Pre-pregnancy BMI ?Covid-19 positive: in MAU on 03/28/21 ? ?Past Medical History:  ?Diagnosis Date  ? Chlamydia   ? PCOS (polycystic ovarian syndrome)   ?  ?Past Surgical History:  ?Procedure Laterality Date  ? DILATION AND CURETTAGE OF UTERUS    ?  ?OB History  ?Gravida Para Term Preterm AB Living  ?3 1 1   1 1   ?SAB IAB Ectopic Multiple Live Births  ?1     0 1  ?  ?# Outcome Date GA Lbr Len/2nd Weight Sex Delivery Anes PTL Lv  ?3 Current           ?2 Term 02/24/14 [redacted]w[redacted]d 10:33 / 06:37 4185 g M Vag-Spont EPI  LIV  ?   Birth Comments: none  ?1 SAB 2001 [redacted]w[redacted]d         ?  ?Social History  ? ?Socioeconomic History  ? Marital status: Single  ?  Spouse name: Not on file  ? Number of children: Not on file  ? Years of education: Not on file  ? Highest education level: Not on file  ?Occupational History  ? Not on file  ?Tobacco Use  ? Smoking status: Former  ?  Packs/day: 0.25  ?  Types: Cigarettes  ? Smokeless tobacco: Never  ?Vaping Use  ? Vaping Use: Never used  ?Substance and Sexual Activity  ? Alcohol use: Not Currently  ? Drug use: No  ? Sexual activity: Yes  ?Other Topics Concern  ? Not on file  ?Social History Narrative  ? Not on file  ? ?Social Determinants of Health  ? ?Financial Resource Strain: Not on file  ?Food Insecurity: Not on file  ?Transportation Needs: Not on file  ?Physical Activity: Not on file  ?Stress: Not on file  ?Social Connections: Not on file  ?Intimate Partner Violence: Not on file  ? Patient has no known allergies.  ? ? ?Prenatal Transfer Tool  ?Maternal Diabetes: No ?Genetic Screening: Normal ?Maternal Ultrasounds/Referrals: Normal ?Fetal Ultrasounds or other Referrals:  None ?Maternal Substance Abuse:  No ?Significant Maternal Medications:  None ?Significant Maternal Lab Results: Group B Strep negative ? ?ABO, Rh: O/Positive/-- (08/11  0000) ?Antibody: Negative (08/11 0000) ?Rubella: Immune (08/11 0000) ?RPR: Nonreactive (08/11 0000)  ?HBsAg: Negative (08/11 0000)  ?HIV: Non-reactive (08/11 0000)  ?GBS: Negative/-- (03/07 0000)  ? ? ?Vitals:  ? 05/04/21 0209  ?BP: 123/63  ?Pulse: 93  ?  ? ?General:  NAD ?Abdomen:  soft, gravid ?SVE:  7/90/-2 per RN ?FHTs:  150s, moderate variability, category 1 ?Toco:  q2-3 minutes ? ? ?A/P   37 y.o. G3P1011 [redacted]w[redacted]d presents with labor ?Admit to L&D ?Anticipate SVD ? ?FSR/ vtx/ GBS negative ? ?Denorris Reust GEFFEL Alek Poncedeleon ? ?

## 2021-05-04 NOTE — MAU Note (Signed)
Patient Access called unit that patient's CTX are very intense and less than 5 mins apart. RN went to pull patient back to room. Pt was contracting in lobby and states feeling nauseous with one episode of emesis. RN brought pt back to room @ 0205. SVE was performed and EFM were applied. FHR 150. SVE 7/90/-2 Vertex. RN obtain IV access, called Labor and Delivery Charge RN, and Philipp Deputy, CNM  for admission orders. New orders given. Pt transported to Labor and Delivery.  ?

## 2021-05-04 NOTE — Progress Notes (Addendum)
Patient comfortable with epidural.   ?EFM: category 1 ?Toco: q2-3 minutes ?SVE 10/100/-2, with contraction BBOW ? ?FOB has not yet arrived.  She declines amniotomy at this time; prefers to await his arrival ? ?Of note, she labored down for 4 hours with G1 prior to pushing for 2 hours ? ?Will place on peanut ball to aid in fetal descent while awaiting FOB and amniotomy ? ? ?

## 2021-05-04 NOTE — Anesthesia Preprocedure Evaluation (Signed)
Anesthesia Evaluation  ?Patient identified by MRN, date of birth, ID band ?Patient awake ? ? ? ?Reviewed: ?Allergy & Precautions, NPO status , Patient's Chart, lab work & pertinent test results ? ?History of Anesthesia Complications ?Negative for: history of anesthetic complications ? ?Airway ?Mallampati: III ? ?TM Distance: >3 FB ?Neck ROM: Full ? ? ? Dental ? ?(+) Teeth Intact ?  ?Pulmonary ?neg shortness of breath, neg sleep apnea, neg COPD, neg recent URI, former smoker,  ?  ?breath sounds clear to auscultation ? ? ? ? ? ? Cardiovascular ?negative cardio ROS ? ? ?Rhythm:Regular  ? ?  ?Neuro/Psych ?negative neurological ROS ? negative psych ROS  ? GI/Hepatic ?negative GI ROS, Neg liver ROS,   ?Endo/Other  ?negative endocrine ROS ? Renal/GU ?negative Renal ROS  ? ?  ?Musculoskeletal ? ? Abdominal ?  ?Peds ? Hematology ? ?(+) Blood dyscrasia, anemia ,   ?Anesthesia Other Findings ? ? Reproductive/Obstetrics ?(+) Pregnancy ? ?  ? ? ? ? ? ? ? ? ? ? ? ? ? ?  ?  ? ? ? ? ? ? ? ? ?Anesthesia Physical ? ?Anesthesia Plan ? ?ASA: 3 ? ?Anesthesia Plan: Epidural  ? ?Post-op Pain Management:   ? ?Induction:  ? ?PONV Risk Score and Plan:  ? ?Airway Management Planned: Simple Face Mask ? ?Additional Equipment: None ? ?Intra-op Plan:  ? ?Post-operative Plan:  ? ?Informed Consent: I have reviewed the patients History and Physical, chart, labs and discussed the procedure including the risks, benefits and alternatives for the proposed anesthesia with the patient or authorized representative who has indicated his/her understanding and acceptance.  ? ? ? ? ? ?Plan Discussed with: Anesthesiologist ? ?Anesthesia Plan Comments:   ? ? ? ? ? ? ?Anesthesia Quick Evaluation ? ?

## 2021-05-04 NOTE — Lactation Note (Signed)
This note was copied from a baby's chart. ?Lactation Consultation Note ? ?Patient Name: Mercedes Riley ?Today's Date: 05/04/2021 ?Reason for consult: Initial assessment;Term ?Age:36 hours ? ?Mom states she is uncomfortable latching but desires to pump and bottle feed.  She has her own pumps she brought.  LC encouraged her to use hospital pump. ? ?LC set up pump and explained use, set up, cleaning.  Storage guidelines reviewed and shown in book to mom.  LC explained different size flanges, suggested removing piercing prior to pumping which mom planned to do.   ? ?Mom called out for RN for bathroom assistance. LC requested RN observe beginning of pumping to ensure correct flange.  ? ?LC encouraged pumping every 3 hours, 8 X daily. ?Mom pumped for 4 months with her first child, now 70 years old.   ? ? ? ? ? ?Maternal Data ?Has patient been taught Hand Expression?: Yes (mom did not return demonstration: waiting to go to the restroom) ?Does the patient have breastfeeding experience prior to this delivery?: Yes ?How long did the patient breastfeed?: pumped for 4 months with 61 year old son ? ?Feeding ?Mother's Current Feeding Choice: Breast Milk ?Nipple Type: Slow - flow ? ?LATCH Score ?  ? ?  ? ?  ? ?  ? ?  ? ?  ? ? ?Lactation Tools Discussed/Used ?  ? ?Interventions ?  ? ?Discharge ?Pump: DEBP (set up per mom's request to pump and bottle feed) ? ?Consult Status ?Consult Status: Follow-up ?Date: 05/05/21 ?Follow-up type: In-patient ? ? ? ?Mercedes Riley ?05/04/2021, 9:49 AM ? ? ? ?

## 2021-05-04 NOTE — Anesthesia Procedure Notes (Addendum)
Epidural ?Patient location during procedure: OB ?Start time: 05/04/2021 2:52 AM ?End time: 05/04/2021 2:58 AM ? ?Staffing ?Anesthesiologist: Bethena Midget, MD ? ?Preanesthetic Checklist ?Completed: patient identified, IV checked, site marked, risks and benefits discussed, surgical consent, monitors and equipment checked, pre-op evaluation and timeout performed ? ?Epidural ?Patient position: sitting ?Prep: DuraPrep and site prepped and draped ?Patient monitoring: continuous pulse ox and blood pressure ?Approach: midline ?Location: L4-L5 ?Injection technique: LOR air ? ?Needle:  ?Needle type: Tuohy  ?Needle gauge: 17 G ?Needle length: 9 cm and 9 ?Needle insertion depth: 9 cm ?Catheter type: closed end flexible ?Catheter size: 19 Gauge ?Catheter at skin depth: 15 cm ?Test dose: negative ? ?Assessment ?Events: blood not aspirated, injection not painful, no injection resistance, no paresthesia and negative IV test ? ? ? ?

## 2021-05-04 NOTE — Progress Notes (Signed)
FOB arrived.  Amniotomy performed with copious clear fluid. ?SVE now 10/100/-1 ?EFM: 150s, moderate variability, + scalp stimulation with ROM ? ?Patient is not feeling any pressure and minimal descent with pushing.  Will place on peanut ball and allow to labor down ?

## 2021-05-05 ENCOUNTER — Inpatient Hospital Stay (HOSPITAL_COMMUNITY): Payer: BC Managed Care – PPO | Admitting: Anesthesiology

## 2021-05-05 LAB — CBC
HCT: 31.1 % — ABNORMAL LOW (ref 36.0–46.0)
Hemoglobin: 10.3 g/dL — ABNORMAL LOW (ref 12.0–15.0)
MCH: 31.2 pg (ref 26.0–34.0)
MCHC: 33.1 g/dL (ref 30.0–36.0)
MCV: 94.2 fL (ref 80.0–100.0)
Platelets: 224 10*3/uL (ref 150–400)
RBC: 3.3 MIL/uL — ABNORMAL LOW (ref 3.87–5.11)
RDW: 13.8 % (ref 11.5–15.5)
WBC: 13.7 10*3/uL — ABNORMAL HIGH (ref 4.0–10.5)
nRBC: 0 % (ref 0.0–0.2)

## 2021-05-05 LAB — BIRTH TISSUE RECOVERY COLLECTION (PLACENTA DONATION)

## 2021-05-05 MED ORDER — IBUPROFEN 600 MG PO TABS: 600.0000 mg | ORAL_TABLET | Freq: Four times a day (QID) | ORAL | 0 refills | Status: DC

## 2021-05-05 NOTE — Anesthesia Pain Management Evaluation Note (Signed)
?  Anesthesia Pain Consult Note ? ?Patient: Mercedes Riley, 36 y.o., female ? ?Consult Requested by: Marlow Baars, MD ? ?Reason for Consult: post epidural headach ? ?Level of Consciousness: alert ? ?Pain: moderate Pt reports headache that is postural in nature, relieved when laying down.  Pain mainly posterior.  No nausea or vomiting. Denies photophobia. Neuro exam normal. ? ?Last Vitals:  ?Vitals:  ? 05/05/21 0516 05/05/21 1133  ?BP: 128/79 119/70  ?Pulse: 81 70  ?Resp: 18   ?Temp: 36.7 ?C   ?SpO2: 99%   ? ? ?Plan: Epidural Blood patch ? ?Consent:Risks of procedure as well as the alternatives and risks of each were explained to the (patient/caregiver).  Consent for procedure obtained. ? ?No Known Allergies ? ?Physical exam: ?PULM normal and clear to auscultation  ?CARDIO Heart sounds are normal.  Regular rate and rhythm without murmur, gallop or rub. ?Heart regular rate and rhythm  ?OTHER Neuro exam normal  ? ?I have reviewed the patient's medications listed below. ?? ibuprofen  600 mg Oral Q6H  ?? prenatal multivitamin  1 tablet Oral Q1200  ?? senna-docusate  2 tablet Oral Q24H  ?? Tdap  0.5 mL Intramuscular Once  ? ? ?acetaminophen, benzocaine-Menthol, coconut oil, witch hazel-glycerin **AND** dibucaine, diphenhydrAMINE, ondansetron **OR** ondansetron (ZOFRAN) IV, oxyCODONE, oxyCODONE, simethicone ? ?Past Medical History:  ?Diagnosis Date  ?? Chlamydia   ?? PCOS (polycystic ovarian syndrome)   ? ?Past Surgical History:  ?Procedure Laterality Date  ?? DILATION AND CURETTAGE OF UTERUS    ? ? reports that she has quit smoking. Her smoking use included cigarettes. She smoked an average of .25 packs per day. She has never used smokeless tobacco. She reports that she does not currently use alcohol. She reports that she does not use drugs.  ? ? ?Tambria Pfannenstiel DANIEL ?05/05/2021 ? ? ? ? ?

## 2021-05-05 NOTE — Progress Notes (Signed)
Patient discharge teaching completed.

## 2021-05-05 NOTE — Progress Notes (Signed)
Pt is now requesting discharge, I think she is stable enough for this ?

## 2021-05-05 NOTE — Progress Notes (Addendum)
Dr Fransisco Beau, anesthesiologist, stated that the patient could have motrin.The  Patient had a blood patch placed today. ? ?

## 2021-05-05 NOTE — Anesthesia Procedure Notes (Signed)
Epidural ?Patient location during procedure: OB ?Start time: 05/05/2021 11:21 AM ?End time: 05/05/2021 11:36 AM ? ?Staffing ?Anesthesiologist: Heather Roberts, MD ?Performed: anesthesiologist  ? ?Preanesthetic Checklist ?Completed: patient identified, IV checked, risks and benefits discussed, surgical consent, monitors and equipment checked, pre-op evaluation and timeout performed ? ?Epidural ?Patient position: sitting ?Prep: DuraPrep ?Patient monitoring: heart rate and cardiac monitor ?Approach: midline ?Location: L2-L3 ?Injection technique: LOR saline ? ?Needle:  ?Needle type: Tuohy  ?Needle gauge: 17 G ?Needle insertion depth: 6 cm ? ?Assessment ?Events: blood not aspirated, injection not painful, no injection resistance and no paresthesia ? ?Additional Notes ?20 CC sterile blood drawn at time of epidural placement.  20 cc injected into the epidural space without complaints.  Patient reports improvement in pain immediately.  Discussed remaining reclined for several hours and no lifting or straining. Tolerated well. ? ? ? ?

## 2021-05-05 NOTE — Progress Notes (Signed)
Patient escorted off MBU by nursing staff. ID bracelets confirmed for both baby and mom.  ?

## 2021-05-05 NOTE — Progress Notes (Signed)
PPD #1 ?C/o headache, better when lies down, worse when sitting or standing, had epidural ?Afeb, VSS ?Fundus firm, NT at U-1 ?Continue routine postpartum care, will have anesthesia evaluate for possible spinal headache ?

## 2021-05-05 NOTE — Progress Notes (Addendum)
Dr. Jackelyn Knife will write an order for patient's discharge.  ?

## 2021-05-05 NOTE — Progress Notes (Addendum)
When the night shift Rn came the patient's room to assess patient, the patient's younger child was crying, but stopped.  The Rn stated to mom that only one support person could stay at night . But children could not stay at night.The Rn said this very quietly so the child could not hear  The patient stated that she was able to have more guest the before  to support her and help her. The Mom became upset. Rn stated she was sorry but this was [facility]'s current visitation policy. Then the patient stated why was I given only one bottle. Rn stated that we usually give only one at a time that is why day shift gave one bottle. The  Rn stated she would  give her another bottle.. ? ?Then the RN stated that she would check with the charge Rn to see if the visitation policy was changed since last week since guest were allowed to stay the night prior. But the  patient was still upset.  ?

## 2021-05-05 NOTE — Progress Notes (Signed)
Patient transported to MAU and moved to bed. RN and Anesthesia team at bedside.   ?

## 2021-05-05 NOTE — Progress Notes (Signed)
Feeling much better since blood patch ?Will keep today for rest, d/c home tomorrow ?

## 2021-05-05 NOTE — Discharge Summary (Signed)
? ?  Postpartum Discharge Summary ? ? ?   ?Patient Name: Mercedes Riley ?DOB: 03-26-1985 ?MRN: [id number] ? ?Date of admission: 05/04/2021 ?Delivery date:05/04/2021  ?Delivering provider: Marlow Baars  ?Date of discharge: 05/05/2021 ? ?Admitting diagnosis: Indication for care in labor and delivery, antepartum [O75.9] ?Intrauterine pregnancy: [redacted]w[redacted]d     ?Secondary diagnosis:  Principal Problem: ?  Indication for care in labor and delivery, antepartum ? ?Additional problems: spinal headache    ?Discharge diagnosis: Term Pregnancy Delivered and post-dural puncture headache                                               ?Post partum procedures: blood patch ? ?Hospital course: Onset of Labor With Vaginal Delivery      ?36 y.o. yo [id number] at [redacted]w[redacted]d was admitted in Latent Labor on 05/04/2021. Patient had an uncomplicated labor course as follows:  ?Membrane Rupture Time/Date: 4:25 AM ,05/04/2021   ?Delivery Method:Vaginal, Spontaneous  ?Episiotomy: None  ?Lacerations:  2nd degree  ?Patient had an uncomplicated postpartum course except for a spinal headache that was immediately relieved with a blood patch by Dr. Krista Blue.  She is ambulating, tolerating a regular diet, passing flatus, and urinating well. Patient is discharged home in stable condition on 05/05/21. ? ?Newborn Data: ?Birth date:05/04/2021  ?Birth time:5:55 AM  ?Gender:Female  ?Living status:Living  ?Apgars:8 ,9  ?Weight:4220 g  ? ? ?Physical exam  ?Vitals:  ? 05/05/21 1205 05/05/21 1216 05/05/21 1231 05/05/21 1247  ?BP: 109/75 111/65 108/67 106/63  ?Pulse: 68 71 73 69  ?Resp:      ?Temp:    97.7 ?F (36.5 ?C)  ?TempSrc:    Oral  ?SpO2:    100%  ?Weight:      ?Height:      ? ?General: alert ?Lochia: appropriate ?Uterine Fundus: firm ? ?Labs: ?Lab Results  ?Component Value Date  ? WBC 13.7 (H) 05/05/2021  ? HGB 10.3 (L) 05/05/2021  ? HCT 31.1 (L) 05/05/2021  ? MCV 94.2 05/05/2021  ? PLT 224 05/05/2021  ? ? ?  Latest Ref Rng & Units 03/28/2021  ?  8:05 PM  ?CMP  ?Glucose 70 - 99  mg/dL 87    ?BUN 6 - 20 mg/dL 5    ?Creatinine 0.44 - 1.00 mg/dL 8.10    ?Sodium 135 - 145 mmol/L 132    ?Potassium 3.5 - 5.1 mmol/L 3.7    ?Chloride 98 - 111 mmol/L 102    ?CO2 22 - 32 mmol/L 20    ?Calcium 8.9 - 10.3 mg/dL 8.5    ?Total Protein 6.5 - 8.1 g/dL 6.5    ?Total Bilirubin 0.3 - 1.2 mg/dL 0.4    ?Alkaline Phos 38 - 126 U/L 60    ?AST 15 - 41 U/L 25    ?ALT 0 - 44 U/L 22    ? ?Edinburgh Score: ?   ? View : No data to display.  ?  ?  ?  ? ? ? ? ?After visit meds:  ?Allergies as of 05/05/2021   ?No Known Allergies ?  ? ?  ?Medication List  ?  ? ?TAKE these medications   ? ?cyclobenzaprine 10 MG tablet ?Commonly known as: FLEXERIL ?Take 1 tablet (10 mg total) by mouth at bedtime. ?  ?ibuprofen 600 MG tablet ?Commonly known as: ADVIL ?Take 1 tablet (600 mg total)  by mouth every 6 (six) hours. ?Start taking on: May 06, 2021 ?  ?ondansetron 8 MG tablet ?Commonly known as: Zofran ?Take 1 tablet (8 mg total) by mouth every 8 (eight) hours as needed for nausea or vomiting. ?  ? ?  ? ? ? ?Discharge home in stable condition ?Infant Feeding: Bottle ?Infant Disposition:home with mother ?Discharge instruction: per After Visit Summary and Postpartum booklet. ?Activity: Advance as tolerated. Pelvic rest for 6 weeks.  ?Diet: routine diet ?Postpartum Appointment:6 weeks ?Follow up Visit: ? Follow-up Information   ? ? Huel Cote, MD. Schedule an appointment as soon as possible for a visit in 6 week(s).   ?Specialty: Obstetrics and Gynecology ?Contact information: ?[address] ?[address] ?[place] [address] ?[phone] ? ? ?  ?  ? ?  ?  ? ?  ? ? ? ?  ? ?05/05/2021 ?Zenaida Niece, MD ? ? ?

## 2021-05-05 NOTE — Discharge Instructions (Signed)
As per discharge pamphlet °

## 2021-05-05 NOTE — Progress Notes (Signed)
Patient has been assessed to have a probable spinal headache, per OB/GYN and Anesthesiologist. Awaiting call back from MAU Charge RN to say when to bring patient for spinal procedure.  ?

## 2021-05-05 NOTE — MAU Note (Signed)
Pt brought to room 125 from MB for epidural blood patch procedure. Procedure completed by Dr Krista Blue with Sharee Pimple CRNA. Pt to lay flat in bed for 1 hour and then may be moved back to MB room per Dr Krista Blue.  ?

## 2021-05-09 ENCOUNTER — Inpatient Hospital Stay (HOSPITAL_COMMUNITY): Payer: BC Managed Care – PPO

## 2021-05-16 ENCOUNTER — Telehealth (HOSPITAL_COMMUNITY): Payer: Self-pay | Admitting: *Deleted

## 2021-05-16 NOTE — Telephone Encounter (Signed)
Patient voiced no questions or concerns regarding her health at this time. EPDS=0. Patient voiced no questions or concerns regarding infant at this time. Patient reports infant sleeps in a crib on his back or sied. RN reviewed ABCs of safe sleep. Patient verbalized understanding. Patient informed about hospital's virtual postpartum classes and support groups - declined email information at this time. Erline Levine, RN, 05/16/21, [phone]   ?

## 2021-05-22 IMAGING — CR DG LUMBAR SPINE COMPLETE 4+V
5 series · 5 of 5 positions shown · non-contrast
Comparison: None.

CLINICAL DATA: Low back pain

EXAM:
LUMBAR SPINE - COMPLETE 4+ VIEW

[l-spine ap]
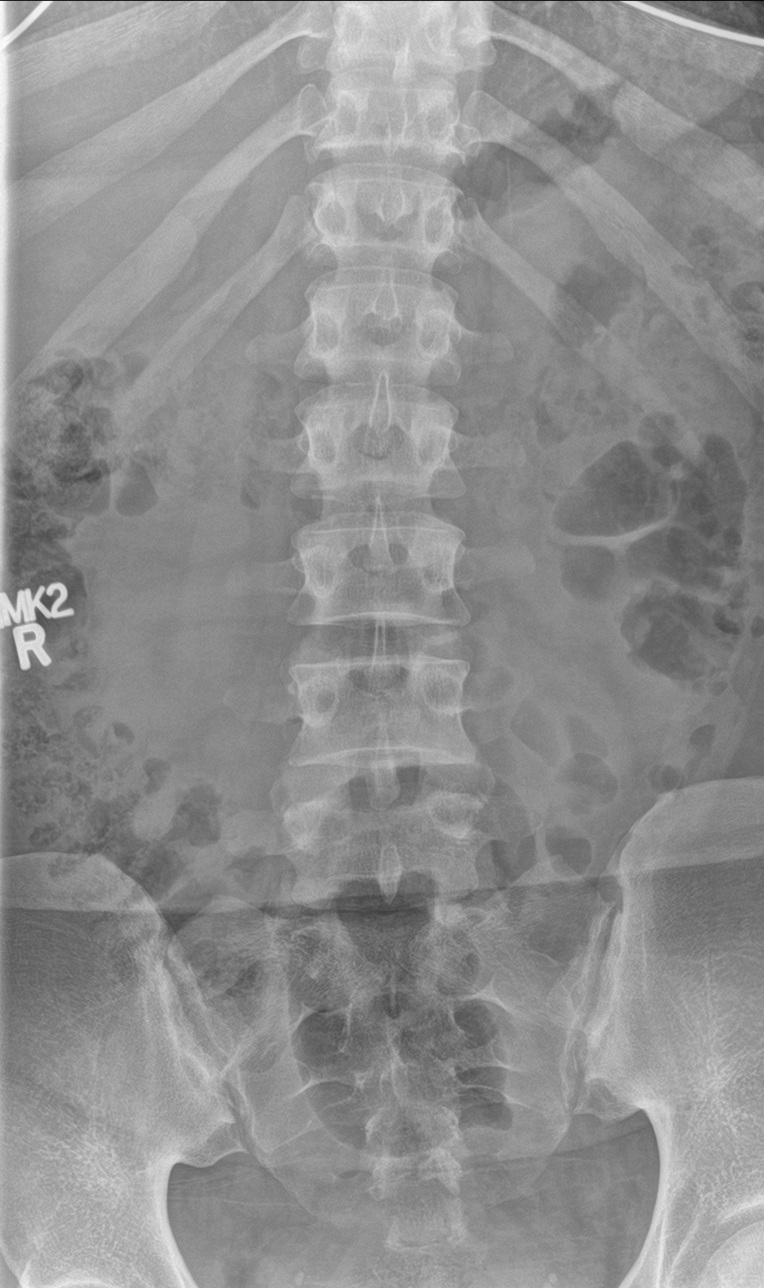

[l-spine obl (1 of 2)]
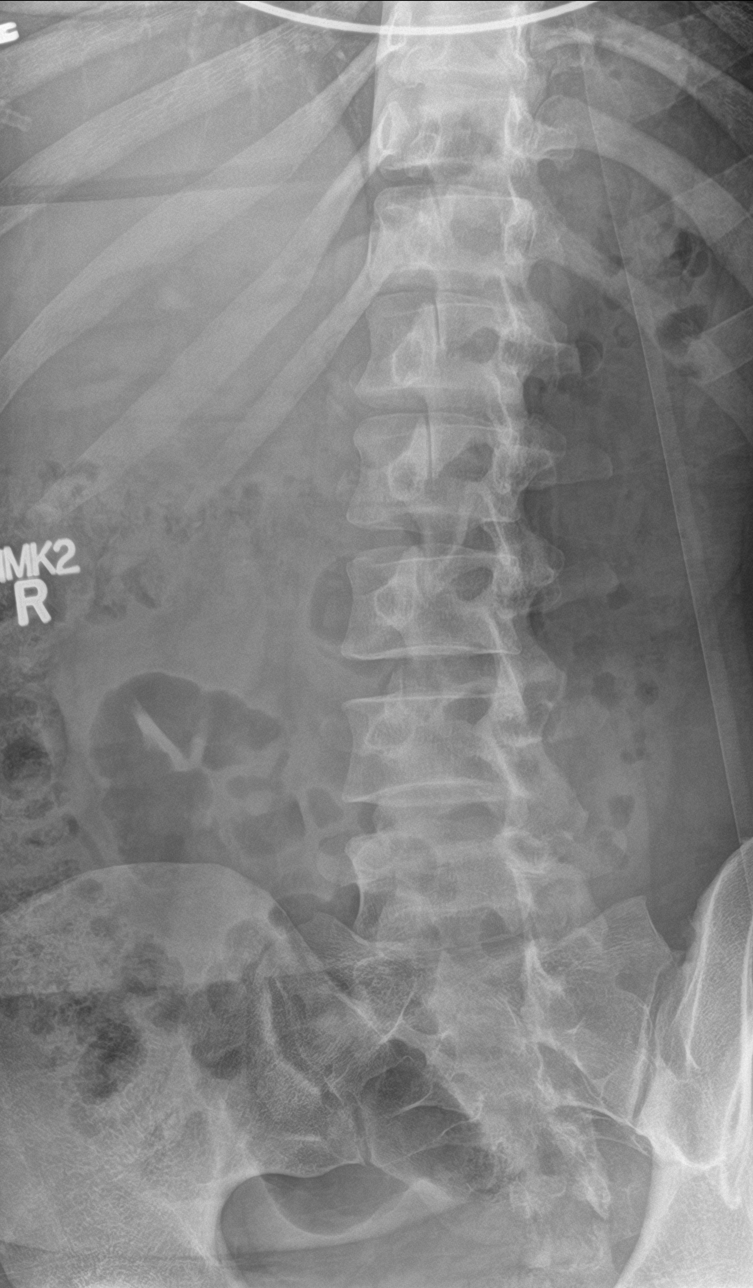

[l-spine obl (2 of 2)]
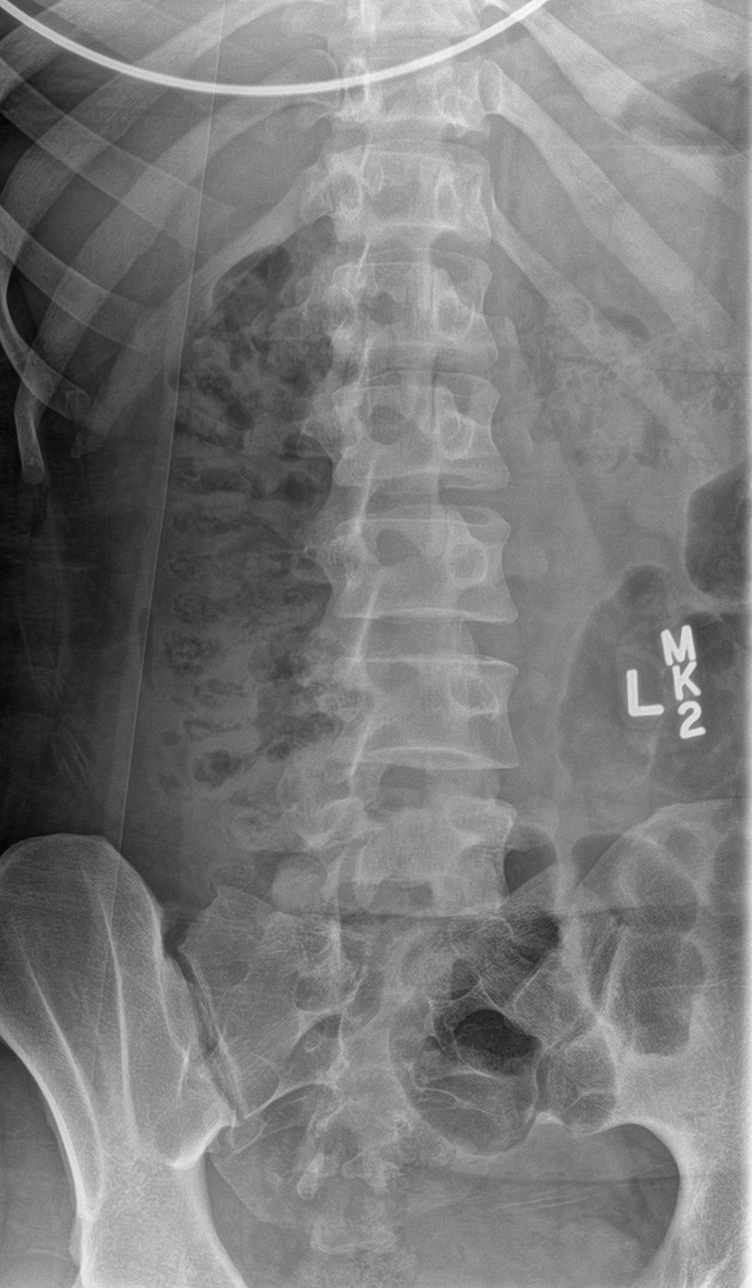

[l-spine lat]
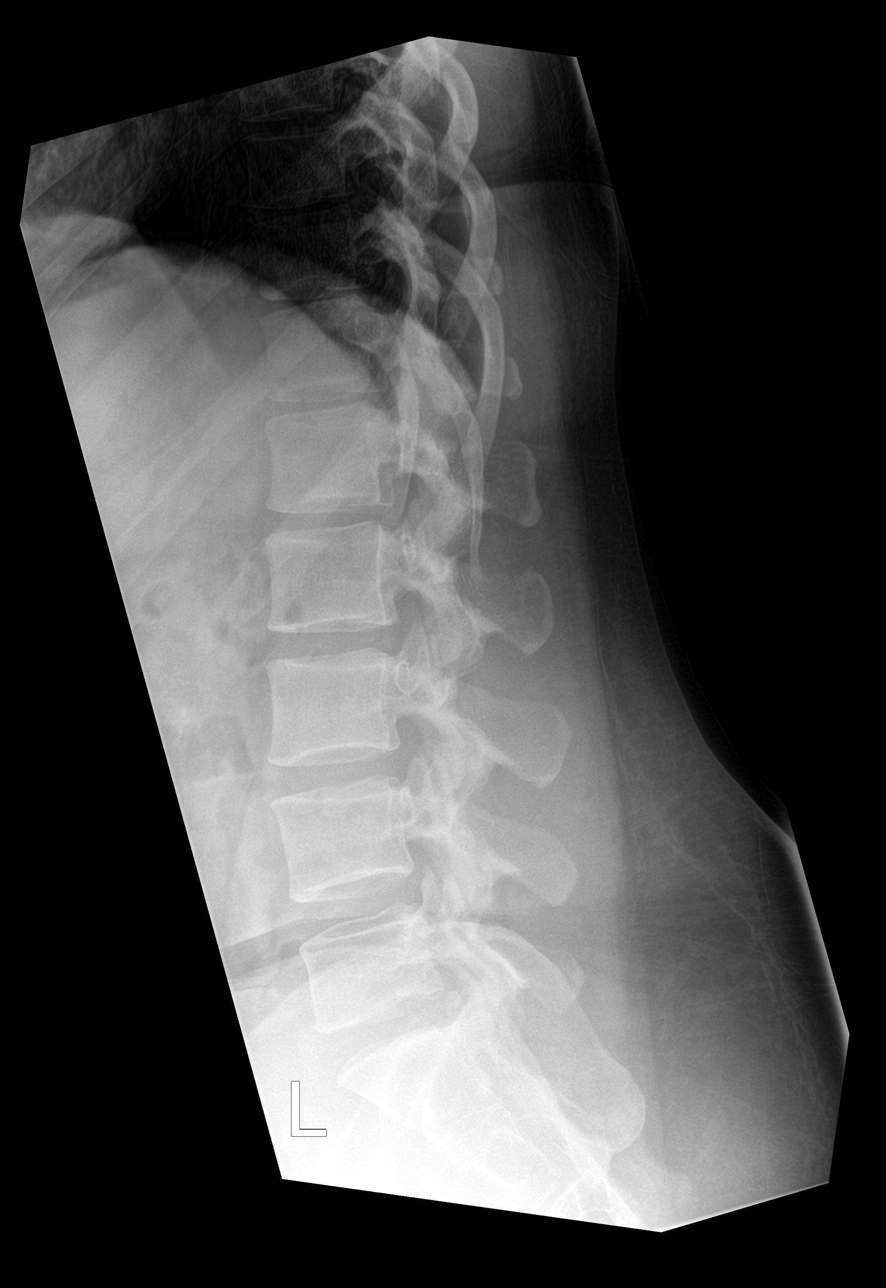

[l-spine spot]
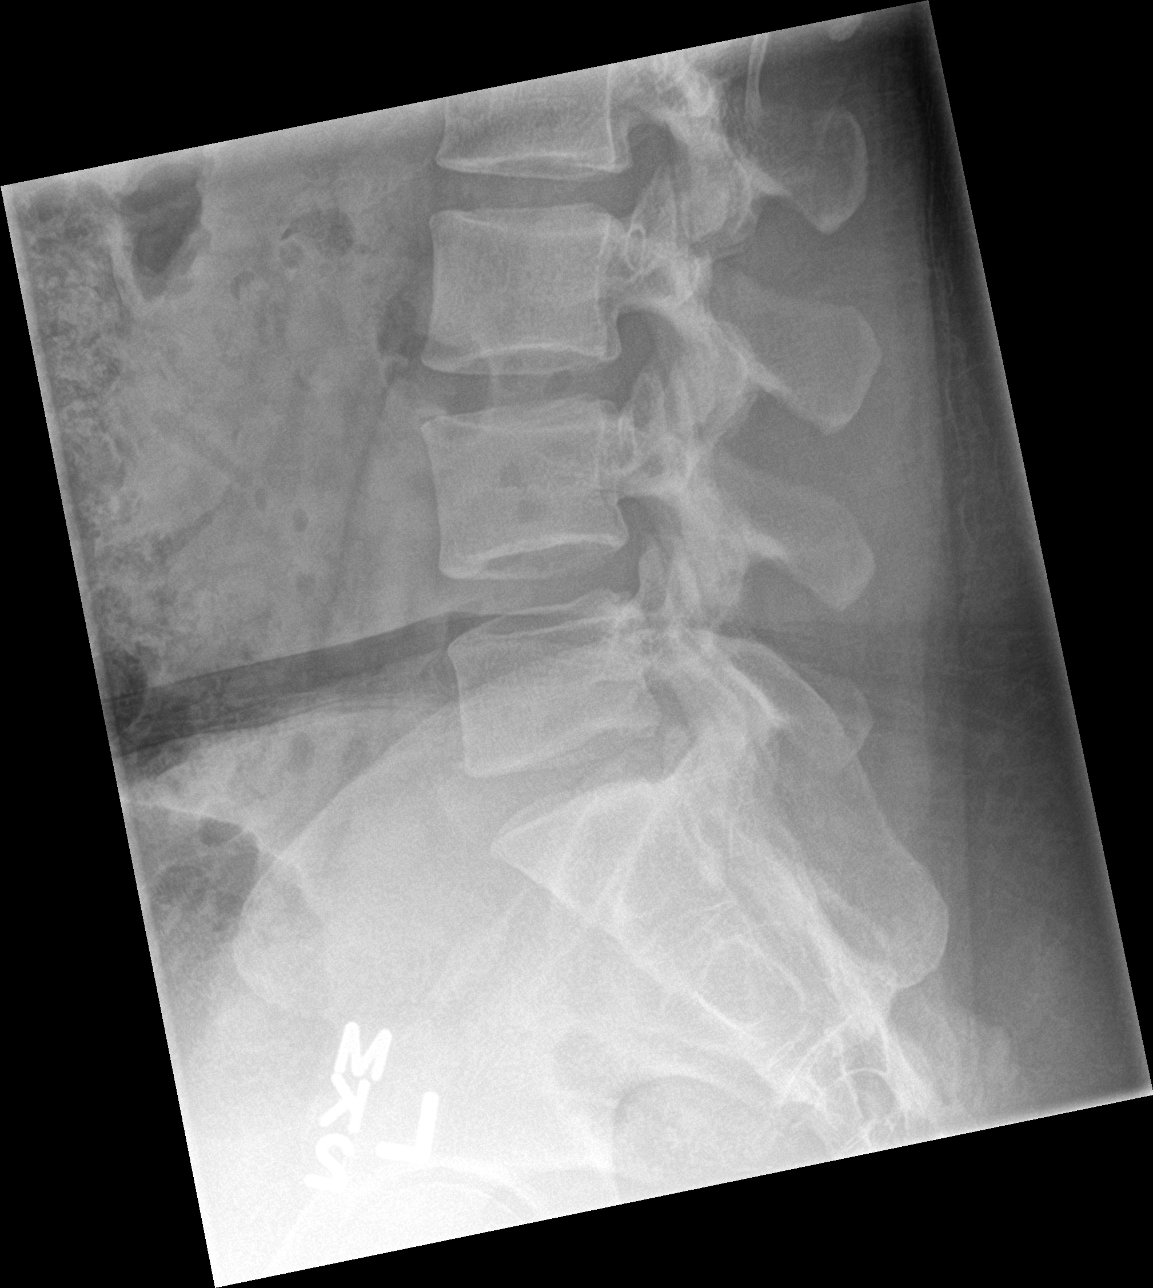

[5 of 5 positions shown; findings below may reference images not displayed]

FINDINGS: Frontal, lateral, spot lumbosacral lateral, and bilateral oblique
views were obtained. There are 5 non-rib-bearing lumbar type
vertebral bodies. There is slight lumbar dextroscoliosis. There is
no fracture or spondylolisthesis. The disc spaces appear
unremarkable. There is no appreciable facet arthropathy.
IMPRESSION: Slight lumbar dextroscoliosis. No fracture or spondylolisthesis. No
appreciable arthropathy.

## 2022-05-02 ENCOUNTER — Emergency Department (HOSPITAL_BASED_OUTPATIENT_CLINIC_OR_DEPARTMENT_OTHER): Payer: BC Managed Care – PPO

## 2022-05-02 ENCOUNTER — Encounter (HOSPITAL_BASED_OUTPATIENT_CLINIC_OR_DEPARTMENT_OTHER): Payer: Self-pay | Admitting: Emergency Medicine

## 2022-05-02 ENCOUNTER — Emergency Department (HOSPITAL_BASED_OUTPATIENT_CLINIC_OR_DEPARTMENT_OTHER)
Admission: EM | Admit: 2022-05-02 | Discharge: 2022-05-02 | Disposition: A | Discharge status: Home / Self Care | Payer: BC Managed Care – PPO | Attending: Emergency Medicine | Admitting: Emergency Medicine

## 2022-05-02 ENCOUNTER — Other Ambulatory Visit: Payer: Self-pay

## 2022-05-02 DIAGNOSIS — R09A2 Foreign body sensation, throat: Secondary | ICD-10-CM | POA: Insufficient documentation

## 2022-05-02 DIAGNOSIS — R1110 Vomiting, unspecified: Secondary | ICD-10-CM | POA: Diagnosis not present

## 2022-05-02 DIAGNOSIS — R1111 Vomiting without nausea: Secondary | ICD-10-CM

## 2022-05-02 LAB — PREGNANCY, URINE: Preg Test, Ur: NEGATIVE

## 2022-05-02 MED ORDER — ALUM & MAG HYDROXIDE-SIMETH 200-200-20 MG/5ML PO SUSP
30.0000 mL | Freq: Once | ORAL | Status: AC
  Administered 2022-05-02: 30 mL via ORAL
  Filled 2022-05-02: qty 30

## 2022-05-02 MED ORDER — SUCRALFATE 1 G PO TABS: 1.0000 g | ORAL_TABLET | Freq: Three times a day (TID) | ORAL | 0 refills | Status: DC

## 2022-05-02 NOTE — ED Triage Notes (Signed)
Pt reports she was playing w/ her kid and she swallowed an earring about three hours ago. She states it is in her throat and hurts.  She started vomiting but has not vomited in the past hour.  Pt is able to speak in complete sentences and there is no sign of distress noted.

## 2022-05-02 NOTE — ED Provider Notes (Signed)
[facility] Provider Note   CSN: [id number] Arrival date & time: 05/02/22  1214     History  Chief Complaint  Patient presents with   Swallowed Foreign Body    Mercedes Riley is a 37 y.o. female.  Has any chronic medical conditions presents the ER for irritation in her throat and vomiting after swallowing a earring today.  This occurred while she was playing with her child.  She is complaining of a feeling of irritation in her throat described as a 4 out of 10 pain scale.  Denies hematemesis, no hemoptysis, no cough or shortness of breath.  HPI     Home Medications Prior to Admission medications   Medication Sig Start Date End Date Taking? Authorizing Provider  sucralfate (CARAFATE) 1 g tablet Take 1 tablet (1 g total) by mouth 4 (four) times daily -  with meals and at bedtime. 05/02/22  Yes Malkia Nippert A, PA-C  cyclobenzaprine (FLEXERIL) 10 MG tablet Take 1 tablet (10 mg total) by mouth at bedtime. 01/26/19   Lawyer, Harrell Gave, PA-C  ibuprofen (ADVIL) 600 MG tablet Take 1 tablet (600 mg total) by mouth every 6 (six) hours. 05/06/21   Meisinger, Sherren Mocha, MD  ondansetron (ZOFRAN) 8 MG tablet Take 1 tablet (8 mg total) by mouth every 8 (eight) hours as needed for nausea or vomiting. 03/28/21   Wende Mott, CNM      Allergies    Patient has no known allergies.    Review of Systems   Review of Systems  Physical Exam Updated Vital Signs BP 131/86 (BP Location: Right Arm)   Pulse 80   Temp 98.3 F (36.8 C) (Oral)   Resp 16   Ht 5\' 9"  (1.753 m)   Wt 108.9 kg   SpO2 100%   BMI 35.44 kg/m  Physical Exam Vitals and nursing note reviewed.  Constitutional:      General: She is not in acute distress.    Appearance: She is well-developed.  HENT:     Head: Normocephalic and atraumatic.     Nose: Nose normal.     Mouth/Throat:     Mouth: Mucous membranes are moist.     Pharynx: Oropharynx is clear. No posterior oropharyngeal  erythema.  Eyes:     Conjunctiva/sclera: Conjunctivae normal.  Cardiovascular:     Rate and Rhythm: Normal rate and regular rhythm.     Heart sounds: No murmur heard. Pulmonary:     Effort: Pulmonary effort is normal. No respiratory distress.     Breath sounds: Normal breath sounds.  Abdominal:     Palpations: Abdomen is soft.     Tenderness: There is no abdominal tenderness.  Genitourinary:    General: Normal vulva.  Musculoskeletal:        General: No swelling. Normal range of motion.     Cervical back: Neck supple.  Skin:    General: Skin is warm and dry.     Capillary Refill: Capillary refill takes less than 2 seconds.  Neurological:     General: No focal deficit present.     Mental Status: She is alert and oriented to person, place, and time.  Psychiatric:        Mood and Affect: Mood normal.     ED Results / Procedures / Treatments   Labs (all labs ordered are listed, but only abnormal results are displayed) Labs Reviewed  PREGNANCY, URINE    EKG None  Radiology DG Abdomen 1  View  Result Date: 05/02/2022 CLINICAL DATA:  Swallowed an earring bile 3 hours ago. EXAM: ABDOMEN - 1 VIEW COMPARISON:  None Available. FINDINGS: Metal foreign body projects over the central abdomen suspected to be a umbilical region piercing. If there is no umbilical piercing on exam, were, this could reflect an ingested foreign body. No other evidence of a radiopaque foreign body. Normal bowel gas pattern. Soft tissues are unremarkable. Skeletal structures are unremarkable. IMPRESSION: 1. Presumed umbilical piercing with clinical correlation recommended. Exam otherwise unremarkable. Electronically Signed   By: Lajean Manes M.D.   On: 05/02/2022 14:09   DG Chest Portable 1 View  Result Date: 05/02/2022 CLINICAL DATA:  Swallowed an earring about 3 hours ago. EXAM: PORTABLE CHEST 1 VIEW COMPARISON:  01/22/2014. FINDINGS: Normal heart, mediastinum and hila. Clear lungs.  No pleural effusion or  pneumothorax. Skeletal structures are unremarkable. No evidence of a radiopaque foreign body. IMPRESSION: 1. Normal frontal chest radiograph.  No radiopaque foreign body. Electronically Signed   By: Lajean Manes M.D.   On: 05/02/2022 14:06    Procedures Procedures    Medications Ordered in ED Medications  alum & mag hydroxide-simeth (MAALOX/MYLANTA) 200-200-20 MG/5ML suspension 30 mL (30 mLs Oral Given 05/02/22 1501)    ED Course/ Medical Decision Making/ A&P                             Medical Decision Making This patient presents to the ED for concern of swallowed foreign body, this involves an extensive number of treatment options, and is a complaint that carries with it a high risk of complications and morbidity.  The differential diagnosis includes esophageal foreign body, foreign body aspiration, foreign body of GI tract, esophagitis, other Imaging Studies ordered:  I ordered imaging studies including chest x-ray and abdominal x-ray I independently visualized and interpreted imaging which showed no foreign bodies, chest x-ray right nipple ring and abdominal x-ray shows patient's umbilical piercing.  No foreign body located.  I agree with the radiologist interpretation   Problem List / ED Course / Critical interventions / Medication management  Reports she swallowed an earring and was vomiting today.  Has persistent foreign body sensation at the level of the sternal notch.  No difficulty swallowing or breathing.  No hematemesis or hematochezia.  No abdominal tenderness.  X-ray of chest and abdomen were ordered to try to locate foreign body.  Patient does confirm that had a metal post.  No foreign body noted on chest x-ray or abdominal x-ray though patient's piercings were visualized.  Discussed with patient she likely regurgitate this while vomiting.  Giving GI cocktail for symptomatic relief. I discussed with patient she most likely vomited up the foreign body.  She was given strict  return precautions and symptomatic treatment for possible esophageal irritation as in the foreign body sensation at the level of renal notch Reevaluation of the patient after these medicines showed that the patient improved I have reviewed the patients home medicines and have made adjustments as needed      Amount and/or Complexity of Data Reviewed Labs: ordered. Radiology: ordered.  Risk OTC drugs. Prescription drug management.           Final Clinical Impression(s) / ED Diagnoses Final diagnoses:  Vomiting without nausea, unspecified vomiting type    Rx / DC Orders ED Discharge Orders          Ordered    sucralfate (CARAFATE) 1 g tablet  3 times daily with meals & bedtime        05/02/22 [address] 05/02/22 Aura Fey, MD 05/03/22 2110

## 2022-05-02 NOTE — Discharge Instructions (Addendum)
You are seen today for swelling and earring.  X-rays of your chest and your abdomen do not show any signs of a foreign body.  Likely you vomited this up.  If you continue having the sensation of foreign body in your throat you can follow-up with GI.  The sucralfate can sometimes help with the sensation as well. You have worsening pain, persistent coughing, trouble breathing, vomiting blood or any other worsening symptoms come back to the ER.

## 2023-06-15 ENCOUNTER — Other Ambulatory Visit: Payer: Self-pay

## 2023-06-15 ENCOUNTER — Emergency Department (HOSPITAL_BASED_OUTPATIENT_CLINIC_OR_DEPARTMENT_OTHER): Admitting: Radiology

## 2023-06-15 ENCOUNTER — Encounter (HOSPITAL_BASED_OUTPATIENT_CLINIC_OR_DEPARTMENT_OTHER): Payer: Self-pay | Admitting: *Deleted

## 2023-06-15 DIAGNOSIS — S92415A Nondisplaced fracture of proximal phalanx of left great toe, initial encounter for closed fracture: Secondary | ICD-10-CM | POA: Insufficient documentation

## 2023-06-15 DIAGNOSIS — Z87891 Personal history of nicotine dependence: Secondary | ICD-10-CM | POA: Insufficient documentation

## 2023-06-15 DIAGNOSIS — W010XXA Fall on same level from slipping, tripping and stumbling without subsequent striking against object, initial encounter: Secondary | ICD-10-CM | POA: Diagnosis not present

## 2023-06-15 DIAGNOSIS — M7989 Other specified soft tissue disorders: Secondary | ICD-10-CM | POA: Insufficient documentation

## 2023-06-15 DIAGNOSIS — S99922A Unspecified injury of left foot, initial encounter: Secondary | ICD-10-CM | POA: Diagnosis present

## 2023-06-15 NOTE — ED Triage Notes (Signed)
 Patient to ED after tripping over her child's toy. She reports her left big toe was caught under her foot and the p[ain and swelling continues to worsen since the injury earlier today.

## 2023-06-16 ENCOUNTER — Emergency Department (HOSPITAL_BASED_OUTPATIENT_CLINIC_OR_DEPARTMENT_OTHER)
Admission: EM | Admit: 2023-06-16 | Discharge: 2023-06-16 | Disposition: A | Discharge status: Home / Self Care | Attending: Emergency Medicine | Admitting: Emergency Medicine

## 2023-06-16 DIAGNOSIS — S92406A Nondisplaced unspecified fracture of unspecified great toe, initial encounter for closed fracture: Secondary | ICD-10-CM

## 2023-06-16 NOTE — Discharge Instructions (Addendum)
 You were evaluated in the Emergency Department and after careful evaluation, we did not find any emergent condition requiring admission or further testing in the hospital.  Your exam/testing today was overall reassuring.  X-ray shows that you broke your big toe.  Use the shoe provided and you can also use the crutches as needed for comfort.  Recommend follow-up with orthopedics to ensure proper healing.  Can use Tylenol  or Motrin  for discomfort.  Please return to the Emergency Department if you experience any worsening of your condition.  Thank you for allowing us  to be a part of your care.

## 2023-06-16 NOTE — ED Provider Notes (Signed)
 [facility]ERGENCY [facility] Emergency Department Provider Note MRN:  [id number]  Arrival date & time: 06/16/23     Chief Complaint   Toe Injury   History of Present Illness   Mercedes Riley is a 38 y.o. year-old female with no pertinent past medical history presenting to the ED with chief complaint of toe injury.  Tripped on one of her child's toys and then banged her left great toe causing a lot of pain.  The pain worsened throughout the evening.  Here for evaluation.  Denies any other injuries.  Review of Systems  A thorough review of systems was obtained and all systems are negative except as noted in the HPI and PMH.   Patient's Health History    Past Medical History:  Diagnosis Date   Chlamydia    PCOS (polycystic ovarian syndrome)     Past Surgical History:  Procedure Laterality Date   DILATION AND CURETTAGE OF UTERUS      Family History  Problem Relation Age of Onset   Diabetes Mother     Social History   Socioeconomic History   Marital status: Single    Spouse name: Not on file   Number of children: Not on file   Years of education: Not on file   Highest education level: Not on file  Occupational History   Not on file  Tobacco Use   Smoking status: Former    Current packs/day: 0.25    Types: Cigarettes   Smokeless tobacco: Never  Vaping Use   Vaping status: Never Used  Substance and Sexual Activity   Alcohol use: Not Currently   Drug use: No   Sexual activity: Yes  Other Topics Concern   Not on file  Social History Narrative   Not on file   Social Drivers of Health   Financial Resource Strain: Not on file  Food Insecurity: Not on file  Transportation Needs: Not on file  Physical Activity: Not on file  Stress: Not on file (12/14/2022)  Social Connections: Not on file  Intimate Partner Violence: Low Risk  (02/19/2020)   Received from [facility]   Intimate Partner Violence    Insults You: Not on file    Threatens  You: Not on file    Screams at [place]: Not on file    Physically Hurt: Not on file    Intimate Partner Violence Score: Not on file     Physical Exam   Vitals:   06/15/23 2250  BP: 100/72  Pulse: 100  Resp: 20  Temp: 97.8 F (36.6 C)  SpO2: 95%    CONSTITUTIONAL: Well-appearing, NAD NEURO/PSYCH:  Alert and oriented x 3, no focal deficits EYES:  eyes equal and reactive ENT/NECK:  no LAD, no JVD CARDIO: Regular rate, well-perfused, normal S1 and S2 PULM:  CTAB no wheezing or rhonchi GI/GU:  non-distended, non-tender MSK/SPINE:  No gross deformities, no edema SKIN:  no rash, atraumatic   *Additional and/or pertinent findings included in MDM below  Diagnostic and Interventional Summary    EKG Interpretation Date/Time:    Ventricular Rate:    PR Interval:    QRS Duration:    QT Interval:    QTC Calculation:   R Axis:      Text Interpretation:         Labs Reviewed - No data to display  DG Foot Complete Left  Final Result      Medications - No data to display   Procedures  /  Critical Care Procedures  ED Course and Medical Decision Making  Initial Impression and Ddx The left great toe is mildly swollen and erythematous and there is fair amount of tenderness at the base.  The foot and ankle are nontender, the toe is neurovascularly intact.  Question sprain versus bruising versus fracture.  Past medical/surgical history that increases complexity of ED encounter: None  Interpretation of Diagnostics I personally reviewed the toe x-ray and my interpretation is as follows: Comminuted fracture extending into the joint.    Patient Reassessment and Ultimate Disposition/Management     Referred to orthopedics, appropriate for discharge.  Patient management required discussion with the following services or consulting groups:  None  Complexity of Problems Addressed Acute illness or injury that poses threat of life of bodily function  Additional Data Reviewed and  Analyzed Further history obtained from: None  Additional Factors Impacting ED Encounter Risk None  Merrick Abe. Harless Lien, MD [facility] Emergency Medicine [facility] [email or web address]  Final Clinical Impressions(s) / ED Diagnoses     ICD-10-CM   1. Closed nondisplaced fracture of phalanx of great toe, unspecified laterality, unspecified phalanx, initial encounter  S92.406A       ED Discharge Orders     None        Discharge Instructions Discussed with and Provided to Patient:   Discharge Instructions      You were evaluated in the Emergency Department and after careful evaluation, we did not find any emergent condition requiring admission or further testing in the hospital.  Your exam/testing today was overall reassuring.  X-ray shows that you broke your big toe.  Use the shoe provided and you can also use the crutches as needed for comfort.  Recommend follow-up with orthopedics to ensure proper healing.  Can use Tylenol  or Motrin  for discomfort.  Please return to the Emergency Department if you experience any worsening of your condition.  Thank you for allowing us  to be a part of your care.       Edson Graces, MD 06/16/23 [phone]

## 2023-07-03 ENCOUNTER — Ambulatory Visit: Admitting: Family

## 2023-11-14 ENCOUNTER — Encounter (HOSPITAL_COMMUNITY): Payer: Self-pay | Admitting: Obstetrics and Gynecology

## 2023-11-14 NOTE — Progress Notes (Signed)
 Spoke w/ via phone for pre-op interview--- Harlene Lab needs dos----  UPT and CBC per surgeon.        Lab results------ COVID test -----patient states asymptomatic no test needed Arrive at -------0730 NPO after MN NO Solid Food.   Pre-Surgery Ensure or G2:  Med rec completed Medications to take morning of surgery ----- Larin Diabetic medication -----  GLP1 agonist last dose: GLP1 instructions:  Patient instructed no nail polish to be worn day of surgery Patient instructed to bring photo id and insurance card day of surgery Patient aware to have Driver (ride ) / caregiver    for 24 hours after surgery - Mother Shakena Callari Patient Special Instructions ----- Pre-Op special Instructions -----  Patient verbalized understanding of instructions that were given at this phone interview. Patient denies chest pain, sob, fever, cough at the interview.

## 2023-11-20 NOTE — Anesthesia Preprocedure Evaluation (Signed)
 Anesthesia Evaluation    Reviewed: Allergy & Precautions, Patient's Chart, lab work & pertinent test results  History of Anesthesia Complications Negative for: history of anesthetic complications  Airway Mallampati: III  TM Distance: >3 FB Neck ROM: Full    Dental  (+) Teeth Intact   Pulmonary neg shortness of breath, neg sleep apnea, neg COPD, neg recent URI, former smoker   breath sounds clear to auscultation       Cardiovascular negative cardio ROS  Rhythm:Regular     Neuro/Psych negative neurological ROS  negative psych ROS   GI/Hepatic negative GI ROS, Neg liver ROS,,,  Endo/Other  negative endocrine ROS    Renal/GU negative Renal ROS     Musculoskeletal   Abdominal   Peds  Hematology  (+) Blood dyscrasia, anemia   Anesthesia Other Findings   Reproductive/Obstetrics                              Anesthesia Physical Anesthesia Plan  ASA: 3  Anesthesia Plan: General   Post-op Pain Management: Tylenol  PO (pre-op)* and Celebrex PO (pre-op)*   Induction: Intravenous  PONV Risk Score and Plan: 3 and Ondansetron , Dexamethasone and Treatment may vary due to age or medical condition  Airway Management Planned: Oral ETT  Additional Equipment: None  Intra-op Plan:   Post-operative Plan: Extubation in OR  Informed Consent: I have reviewed the patients History and Physical, chart, labs and discussed the procedure including the risks, benefits and alternatives for the proposed anesthesia with the patient or authorized representative who has indicated his/her understanding and acceptance.       Plan Discussed with: Anesthesiologist and CRNA  Anesthesia Plan Comments: (  )         Anesthesia Quick Evaluation

## 2023-11-20 NOTE — H&P (Signed)
 Mercedes Riley is an 38 y.o. female. She desires permanent sterility. She would also like Mirena IUD for heavy, irregular menses from PCOS  Pertinent Gynecological History: Last pap: normal Date: 2022 OB History: G5, P2032   Menstrual History: No LMP recorded (lmp unknown).    Past Medical History:  Diagnosis Date   Chlamydia    PCOS (polycystic ovarian syndrome)     Past Surgical History:  Procedure Laterality Date   DILATION AND CURETTAGE OF UTERUS      Family History  Problem Relation Age of Onset   Diabetes Mother     Social History:  reports that she has quit smoking. Her smoking use included cigarettes. She has never used smokeless tobacco. She reports that she does not currently use alcohol. She reports that she does not use drugs.  Allergies:  Allergies  Allergen Reactions   Latex Swelling    No medications prior to admission.    Review of Systems  Respiratory: Negative.    Cardiovascular: Negative.     Weight 112.5 kg, not currently breastfeeding. Physical Exam Constitutional:      Appearance: Normal appearance.  Cardiovascular:     Rate and Rhythm: Normal rate and regular rhythm.     Heart sounds: Normal heart sounds. No murmur heard. Pulmonary:     Effort: Pulmonary effort is normal. No respiratory distress.     Breath sounds: Normal breath sounds. No wheezing.  Abdominal:     General: There is no distension.     Palpations: Abdomen is soft. There is no mass.     Tenderness: There is no abdominal tenderness.  Genitourinary:    General: Normal vulva.     Comments: Uterus normal size No adnexal mass Musculoskeletal:     Cervical back: Normal range of motion and neck supple.  Neurological:     Mental Status: She is alert.     No results found for this or any previous visit (from the past 24 hours).  No results found.  Assessment/Plan: Desires permanent sterility, menorrhagia, dysmenorrhea. All options for contraception discussed, she  desires permanent sterilty with bilateral salpingectomy. She also would like Mirena insertion to help with menses. Surgical procedure, risks, alternatives, all discussed, questions answered. Will admit for laparoscopic bilateral salpingectomy, possible Mirena IUD insertion as well.  Krystal BIRCH Mcclellan Demarais 11/20/2023, 5:51 PM

## 2023-11-21 ENCOUNTER — Ambulatory Visit (HOSPITAL_COMMUNITY): Payer: Self-pay | Admitting: Anesthesiology

## 2023-11-21 ENCOUNTER — Ambulatory Visit (HOSPITAL_COMMUNITY)
Admission: RE | Admit: 2023-11-21 | Discharge: 2023-11-21 | Disposition: A | Discharge status: Home / Self Care | Attending: Obstetrics and Gynecology | Admitting: Obstetrics and Gynecology

## 2023-11-21 ENCOUNTER — Encounter (HOSPITAL_COMMUNITY)
Admission: RE | Disposition: A | Discharge status: Home / Self Care | Payer: Self-pay | Source: Home / Self Care | Attending: Obstetrics and Gynecology

## 2023-11-21 ENCOUNTER — Encounter (HOSPITAL_COMMUNITY): Payer: Self-pay | Admitting: Obstetrics and Gynecology

## 2023-11-21 ENCOUNTER — Other Ambulatory Visit: Payer: Self-pay

## 2023-11-21 DIAGNOSIS — N920 Excessive and frequent menstruation with regular cycle: Secondary | ICD-10-CM | POA: Diagnosis not present

## 2023-11-21 DIAGNOSIS — Z975 Presence of (intrauterine) contraceptive device: Secondary | ICD-10-CM | POA: Diagnosis not present

## 2023-11-21 DIAGNOSIS — Z302 Encounter for sterilization: Secondary | ICD-10-CM | POA: Diagnosis present

## 2023-11-21 DIAGNOSIS — Z87891 Personal history of nicotine dependence: Secondary | ICD-10-CM | POA: Insufficient documentation

## 2023-11-21 HISTORY — PX: INTRAUTERINE DEVICE (IUD) INSERTION: SHX5877

## 2023-11-21 HISTORY — PX: LAPAROSCOPIC BILATERAL SALPINGECTOMY: SHX5889

## 2023-11-21 LAB — CBC
HCT: 36.5 % (ref 36.0–46.0)
Hemoglobin: 12.1 g/dL (ref 12.0–15.0)
MCH: 31.8 pg (ref 26.0–34.0)
MCHC: 33.2 g/dL (ref 30.0–36.0)
MCV: 95.8 fL (ref 80.0–100.0)
Platelets: 226 K/uL (ref 150–400)
RBC: 3.81 MIL/uL — ABNORMAL LOW (ref 3.87–5.11)
RDW: 12.6 % (ref 11.5–15.5)
WBC: 8.8 K/uL (ref 4.0–10.5)
nRBC: 0 % (ref 0.0–0.2)

## 2023-11-21 LAB — POCT PREGNANCY, URINE: Preg Test, Ur: NEGATIVE

## 2023-11-21 SURGERY — SALPINGECTOMY, BILATERAL, LAPAROSCOPIC
Anesthesia: General | Site: Uterus

## 2023-11-21 MED ORDER — LEVONORGESTREL 20 MCG/DAY IU IUD
INTRAUTERINE_SYSTEM | INTRAUTERINE | Status: AC
  Filled 2023-11-21: qty 1

## 2023-11-21 MED ORDER — LIDOCAINE 2% (20 MG/ML) 5 ML SYRINGE
INTRAMUSCULAR | Status: AC
Start: 2023-11-21 — End: 2023-11-21
  Filled 2023-11-21: qty 5

## 2023-11-21 MED ORDER — HYDROCODONE-ACETAMINOPHEN 5-325 MG PO TABS: 1.0000 | ORAL_TABLET | Freq: Four times a day (QID) | ORAL | 0 refills | Status: AC | PRN

## 2023-11-21 MED ORDER — OXYCODONE HCL 5 MG PO TABS
ORAL_TABLET | ORAL | Status: AC
  Filled 2023-11-21: qty 1

## 2023-11-21 MED ORDER — FENTANYL CITRATE (PF) 250 MCG/5ML IJ SOLN
INTRAMUSCULAR | Status: AC
  Filled 2023-11-21: qty 5

## 2023-11-21 MED ORDER — CHLORHEXIDINE GLUCONATE 0.12 % MT SOLN
15.0000 mL | Freq: Once | OROMUCOSAL | Status: AC
  Administered 2023-11-21: 15 mL via OROMUCOSAL

## 2023-11-21 MED ORDER — CELECOXIB 200 MG PO CAPS
ORAL_CAPSULE | ORAL | Status: AC
  Filled 2023-11-21: qty 1

## 2023-11-21 MED ORDER — OXYCODONE HCL 5 MG PO TABS
5.0000 mg | ORAL_TABLET | Freq: Once | ORAL | Status: AC | PRN
  Administered 2023-11-21: 5 mg via ORAL

## 2023-11-21 MED ORDER — PHENYLEPHRINE 80 MCG/ML (10ML) SYRINGE FOR IV PUSH (FOR BLOOD PRESSURE SUPPORT)
PREFILLED_SYRINGE | INTRAVENOUS | Status: AC
  Filled 2023-11-21: qty 10

## 2023-11-21 MED ORDER — MIDAZOLAM HCL (PF) 2 MG/2ML IJ SOLN
INTRAMUSCULAR | Status: DC | PRN
  Administered 2023-11-21: 2 mg via INTRAVENOUS

## 2023-11-21 MED ORDER — MEPERIDINE HCL 25 MG/ML IJ SOLN
6.2500 mg | INTRAMUSCULAR | Status: DC | PRN
  Filled 2023-11-21: qty 1

## 2023-11-21 MED ORDER — LIDOCAINE 2% (20 MG/ML) 5 ML SYRINGE
INTRAMUSCULAR | Status: DC | PRN
  Administered 2023-11-21: 60 mg via INTRAVENOUS

## 2023-11-21 MED ORDER — FENTANYL CITRATE (PF) 100 MCG/2ML IJ SOLN
INTRAMUSCULAR | Status: AC
  Filled 2023-11-21: qty 2

## 2023-11-21 MED ORDER — PROPOFOL 10 MG/ML IV BOLUS
INTRAVENOUS | Status: DC | PRN
  Administered 2023-11-21: 200 mg via INTRAVENOUS

## 2023-11-21 MED ORDER — CHLORHEXIDINE GLUCONATE 0.12 % MT SOLN
OROMUCOSAL | Status: DC
Start: 2023-11-21 — End: 2023-11-21
  Filled 2023-11-21: qty 15

## 2023-11-21 MED ORDER — ACETAMINOPHEN 500 MG PO TABS
1000.0000 mg | ORAL_TABLET | Freq: Once | ORAL | Status: AC
  Administered 2023-11-21: 1000 mg via ORAL

## 2023-11-21 MED ORDER — DEXAMETHASONE SOD PHOSPHATE PF 10 MG/ML IJ SOLN
INTRAMUSCULAR | Status: DC | PRN
  Administered 2023-11-21: 10 mg via INTRAVENOUS

## 2023-11-21 MED ORDER — ONDANSETRON HCL 4 MG/2ML IJ SOLN
INTRAMUSCULAR | Status: AC
  Filled 2023-11-21: qty 2

## 2023-11-21 MED ORDER — BUPIVACAINE HCL (PF) 0.25 % IJ SOLN
INTRAMUSCULAR | Status: DC | PRN
  Administered 2023-11-21: 8 mL

## 2023-11-21 MED ORDER — LACTATED RINGERS IV SOLN: INTRAVENOUS | Status: DC

## 2023-11-21 MED ORDER — BUPIVACAINE HCL (PF) 0.25 % IJ SOLN
INTRAMUSCULAR | Status: AC
  Filled 2023-11-21: qty 30

## 2023-11-21 MED ORDER — 0.9 % SODIUM CHLORIDE (POUR BTL) OPTIME
TOPICAL | Status: DC | PRN
  Administered 2023-11-21: 1000 mL

## 2023-11-21 MED ORDER — FENTANYL CITRATE (PF) 100 MCG/2ML IJ SOLN
25.0000 ug | INTRAMUSCULAR | Status: DC | PRN
  Administered 2023-11-21 (×2): 25 ug via INTRAVENOUS

## 2023-11-21 MED ORDER — PROPOFOL 10 MG/ML IV BOLUS
INTRAVENOUS | Status: AC
  Filled 2023-11-21: qty 20

## 2023-11-21 MED ORDER — MIDAZOLAM HCL 2 MG/2ML IJ SOLN
INTRAMUSCULAR | Status: AC
  Filled 2023-11-21: qty 2

## 2023-11-21 MED ORDER — LEVONORGESTREL 20 MCG/DAY IU IUD
1.0000 | INTRAUTERINE_SYSTEM | INTRAUTERINE | Status: AC
  Administered 2023-11-21: 1 via INTRAUTERINE
  Filled 2023-11-21 (×2): qty 1

## 2023-11-21 MED ORDER — FENTANYL CITRATE (PF) 250 MCG/5ML IJ SOLN
INTRAMUSCULAR | Status: DC | PRN
  Administered 2023-11-21: 100 ug via INTRAVENOUS
  Administered 2023-11-21: 50 ug via INTRAVENOUS

## 2023-11-21 MED ORDER — CELECOXIB 200 MG PO CAPS
200.0000 mg | ORAL_CAPSULE | Freq: Once | ORAL | Status: AC
  Administered 2023-11-21: 200 mg via ORAL

## 2023-11-21 MED ORDER — ROCURONIUM BROMIDE 10 MG/ML (PF) SYRINGE
PREFILLED_SYRINGE | INTRAVENOUS | Status: DC | PRN
  Administered 2023-11-21: 50 mg via INTRAVENOUS

## 2023-11-21 MED ORDER — ONDANSETRON HCL 4 MG/2ML IJ SOLN: 4.0000 mg | Freq: Once | INTRAMUSCULAR | Status: DC | PRN

## 2023-11-21 MED ORDER — DEXMEDETOMIDINE HCL IN NACL 80 MCG/20ML IV SOLN
INTRAVENOUS | Status: AC
Start: 2023-11-21 — End: 2023-11-21
  Filled 2023-11-21: qty 20

## 2023-11-21 MED ORDER — DEXMEDETOMIDINE HCL IN NACL 80 MCG/20ML IV SOLN
INTRAVENOUS | Status: DC | PRN
  Administered 2023-11-21: 4 ug via INTRAVENOUS
  Administered 2023-11-21: 8 ug via INTRAVENOUS

## 2023-11-21 MED ORDER — ONDANSETRON HCL 4 MG/2ML IJ SOLN
INTRAMUSCULAR | Status: DC | PRN
  Administered 2023-11-21: 4 mg via INTRAVENOUS

## 2023-11-21 MED ORDER — GLYCOPYRROLATE PF 0.2 MG/ML IJ SOSY
PREFILLED_SYRINGE | INTRAMUSCULAR | Status: DC | PRN
Start: 2023-11-21 — End: 2023-11-21
  Administered 2023-11-21: .2 mg via INTRAVENOUS

## 2023-11-21 MED ORDER — SUGAMMADEX SODIUM 200 MG/2ML IV SOLN
INTRAVENOUS | Status: DC | PRN
  Administered 2023-11-21: 200 mg via INTRAVENOUS

## 2023-11-21 MED ORDER — ORAL CARE MOUTH RINSE: 15.0000 mL | Freq: Once | OROMUCOSAL | Status: AC

## 2023-11-21 MED ORDER — OXYCODONE HCL 5 MG/5ML PO SOLN: 5.0000 mg | Freq: Once | ORAL | Status: AC | PRN

## 2023-11-21 MED ORDER — PHENYLEPHRINE 80 MCG/ML (10ML) SYRINGE FOR IV PUSH (FOR BLOOD PRESSURE SUPPORT)
PREFILLED_SYRINGE | INTRAVENOUS | Status: DC | PRN
  Administered 2023-11-21 (×4): 40 ug via INTRAVENOUS

## 2023-11-21 MED ORDER — ACETAMINOPHEN 500 MG PO TABS
ORAL_TABLET | ORAL | Status: DC
Start: 2023-11-21 — End: 2023-11-21
  Filled 2023-11-21: qty 2

## 2023-11-21 SURGICAL SUPPLY — 31 items
CATH ROBINSON RED A/P 16FR (CATHETERS) IMPLANT
DERMABOND ADVANCED .7 DNX12 (GAUZE/BANDAGES/DRESSINGS) ×2 IMPLANT
DRAPE SURG IRRIG POUCH 19X23 (DRAPES) ×2 IMPLANT
DRSG OPSITE POSTOP 3X4 (GAUZE/BANDAGES/DRESSINGS) IMPLANT
DURAPREP 26ML APPLICATOR (WOUND CARE) ×2 IMPLANT
GLOVE BIOGEL PI IND STRL 7.0 (GLOVE) ×4 IMPLANT
GLOVE ORTHO TXT STRL SZ7.5 (GLOVE) ×2 IMPLANT
GOWN STRL REUS W/ TWL XL LVL3 (GOWN DISPOSABLE) ×2 IMPLANT
IRRIGATION SUCT STRKRFLW 2 WTP (MISCELLANEOUS) IMPLANT
KIT PINK PAD W/HEAD ARM REST (MISCELLANEOUS) ×2 IMPLANT
KIT TURNOVER KIT B (KITS) ×2 IMPLANT
Mirena IUD IMPLANT
NDL INSUFFLATION 14GA 120MM (NEEDLE) ×2 IMPLANT
NEEDLE INSUFFLATION 14GA 120MM (NEEDLE) ×2 IMPLANT
PACK LAPAROSCOPY BASIN (CUSTOM PROCEDURE TRAY) ×2 IMPLANT
SCISSORS LAP 5X45 EPIX DISP (ENDOMECHANICALS) IMPLANT
SET TUBE SMOKE EVAC HIGH FLOW (TUBING) ×2 IMPLANT
SHEARS HARMONIC 36 ACE (MISCELLANEOUS) IMPLANT
SLEEVE Z-THREAD 5X100MM (TROCAR) IMPLANT
SOLN 0.9% NACL 1000 ML (IV SOLUTION) ×2 IMPLANT
SOLN 0.9% NACL POUR BTL 1000ML (IV SOLUTION) ×2 IMPLANT
SOLUTION ELECTROSURG ANTI STCK (MISCELLANEOUS) IMPLANT
SPIKE FLUID TRANSFER (MISCELLANEOUS) ×2 IMPLANT
SUT VIC AB 4-0 PS2 18 (SUTURE) ×2 IMPLANT
SUT VICRYL 0 UR6 27IN ABS (SUTURE) ×2 IMPLANT
SYSTEM BAG RETRIEVAL 10MM (BASKET) IMPLANT
TOWEL GREEN STERILE FF (TOWEL DISPOSABLE) ×2 IMPLANT
TRAY FOLEY W/BAG SLVR 14FR (SET/KITS/TRAYS/PACK) IMPLANT
TROCAR 11X100 Z THREAD (TROCAR) ×2 IMPLANT
TROCAR ADV FIXATION 5X100MM (TROCAR) IMPLANT
WARMER LAPAROSCOPE (MISCELLANEOUS) ×2 IMPLANT

## 2023-11-21 NOTE — Transfer of Care (Signed)
 Immediate Anesthesia Transfer of Care Note  Patient: Mercedes Riley  Procedure(s) Performed: SALPINGECTOMY, BILATERAL, LAPAROSCOPIC (Bilateral: Pelvis) INSERTION, INTRAUTERINE DEVICE (Uterus)  Patient Location: PACU  Anesthesia Type:General  Level of Consciousness: awake, alert , oriented, and patient cooperative  Airway & Oxygen Therapy: Patient Spontanous Breathing and Patient connected to nasal cannula oxygen  Post-op Assessment: Report given to RN, Post -op Vital signs reviewed and stable, Patient moving all extremities X 4, and Patient able to stick tongue midline  Post vital signs: Reviewed and stable  Last Vitals:  Vitals Value Taken Time  BP 117/76   Temp 97.7   Pulse 77 11/21/23 12:29  Resp 11 11/21/23 12:29  SpO2 97 % 11/21/23 12:29  Vitals shown include unfiled device data.  Last Pain:  Vitals:   11/21/23 0730  TempSrc: Oral  PainSc: 0-No pain      Patients Stated Pain Goal: 4 (11/21/23 0730)  Complications: No notable events documented.

## 2023-11-21 NOTE — Op Note (Signed)
 Preoperative diagnosis: Desires surgical sterility, menorrhagia Postoperative diagnosis: Same Procedure: Laparoscopic bilateral salpingectomy, Mirena IUD insertion Surgeon: Krystal Deaner M.D. Anesthesia: Gen. Endotracheal tube Findings: She had a normal abdomen and pelvis with normal uterus tubes and ovaries Specimens: Bilateral fallopian tubes Estimated blood loss: Minimal Complications: None  Procedure in detail  The patient was taken to the operating room and placed in the dorsosupine position. General anesthesia was induced. Her legs were placed in mobile stirrups and her left arm was tucked to her side. Abdomen perineum and vagina were then prepped and draped in the usual sterile fashion, a Foley catheter was inserted, a Hulka tenaculum was applied to the cervix for uterine manipulation. Infraumbilical skin was then infiltrated with quarter percent Marcaine  and a 1 cm vertical incision was made. The veress needle was inserted into the peritoneal cavity and placement confirmed by the water drop test and an opening pressure of 7 mm of mercury. CO2 was insufflated to a pressure of 14 mm of mercury and the veress needle was removed. A 10/11 disposable trocar was then introduced with direct visualization with the laparoscope. A 5 mm port was then placed low in the midline also under direct visualization. Inspection revealed the above-mentioned findings with normal anatomy. The distal end of each tube was grasped and elevated. Using bipolar cautery I was able to free the distal end of each tube from the ovary and fulgurate across the mesosalpinx and a proximal portion of the fallopian tube. Scissors were then used to remove the fallopian tube. This is done bilaterally without difficulty. Both tubes were removed through the umbilical trocar. The 5 mm port was removed under direct visualization. All gas was allowed to deflate from the abdomen and the umbilical trocar was removed. One figure-of-eight suture  of 0 Vicryl was placed in the umbilical incision. Skin incisions were then closed with interrupted subcuticular sutures of 4-0 Vicryl followed by Dermabond.   Attention was turned vaginally. The speculum was reinserted. The Hulka tenaculum was removed. Anterior lip of the cervix grasped with single tooth tenaculum. Uterus sounded to 9 cm. Mirena IUD inserted to 9 cm without difficulty, deployed, released, strings trimmed to 2-3 cm. Tenaculum removed and bleeding controlled with pressure. Speculum removed. Foley removed. The patient was taken down from stirrups. She was awakened in the operating room and taken to the recovery room in stable condition after tolerating the procedure well. Counts were correct and she had PAS hose on throughout the procedure.

## 2023-11-21 NOTE — Anesthesia Procedure Notes (Signed)
 Procedure Name: Intubation Date/Time: 11/21/2023 11:20 AM  Performed by: Viviana Almarie DASEN, CRNAPre-anesthesia Checklist: Patient identified, Emergency Drugs available, Suction available and Patient being monitored Patient Re-evaluated:Patient Re-evaluated prior to induction Oxygen Delivery Method: Circle System Utilized Preoxygenation: Pre-oxygenation with 100% oxygen Induction Type: IV induction Ventilation: Mask ventilation without difficulty Laryngoscope Size: Mac and 3 Grade View: Grade I Tube type: Oral Tube size: 7.0 mm Number of attempts: 1 Airway Equipment and Method: Stylet and Oral airway Placement Confirmation: ETT inserted through vocal cords under direct vision, positive ETCO2 and breath sounds checked- equal and bilateral Secured at: 22 cm Tube secured with: Tape Dental Injury: Teeth and Oropharynx as per pre-operative assessment

## 2023-11-21 NOTE — Anesthesia Postprocedure Evaluation (Signed)
 Anesthesia Post Note  Patient: EDWIN CHERIAN  Procedure(s) Performed: SALPINGECTOMY, BILATERAL, LAPAROSCOPIC (Bilateral: Pelvis) INSERTION, INTRAUTERINE DEVICE (Uterus)     Patient location during evaluation: PACU Anesthesia Type: General Level of consciousness: awake and alert Pain management: pain level controlled Vital Signs Assessment: post-procedure vital signs reviewed and stable Respiratory status: spontaneous breathing, nonlabored ventilation, respiratory function stable and patient connected to nasal cannula oxygen Cardiovascular status: blood pressure returned to baseline and stable Postop Assessment: no apparent nausea or vomiting Anesthetic complications: no   No notable events documented.  Last Vitals:  Vitals:   11/21/23 1315 11/21/23 1330  BP: 107/72 102/68  Pulse: 77 63  Resp: 13 17  Temp:  36.5 C  SpO2: 97% 97%    Last Pain:  Vitals:   11/21/23 1330  TempSrc:   PainSc: 0-No pain   Pain Goal: Patients Stated Pain Goal: 4 (11/21/23 0730)                 Glynis Hunsucker

## 2023-11-21 NOTE — Interval H&P Note (Signed)
 History and Physical Interval Note:  11/21/2023 10:37 AM  Mercedes Riley  has presented today for surgery, with the diagnosis of sterillization.  The various methods of treatment have been discussed with the patient and family. After consideration of risks, benefits and other options for treatment, the patient has consented to  Procedure(s): SALPINGECTOMY, BILATERAL, LAPAROSCOPIC (Bilateral) INSERTION, INTRAUTERINE DEVICE (N/A) as a surgical intervention.  The patient's history has been reviewed, patient examined, no change in status, stable for surgery.  I have reviewed the patient's chart and labs.  Questions were answered to the patient's satisfaction.     Krystal BIRCH Brown Dunlap

## 2023-11-22 ENCOUNTER — Encounter (HOSPITAL_COMMUNITY): Payer: Self-pay | Admitting: Obstetrics and Gynecology

## 2023-11-25 LAB — SURGICAL PATHOLOGY

## 2023-12-08 ENCOUNTER — Emergency Department (HOSPITAL_COMMUNITY)

## 2023-12-08 ENCOUNTER — Emergency Department (HOSPITAL_COMMUNITY): Admission: EM | Admit: 2023-12-08 | Discharge: 2023-12-08 | Disposition: A | Discharge status: Home / Self Care

## 2023-12-08 DIAGNOSIS — Z23 Encounter for immunization: Secondary | ICD-10-CM | POA: Insufficient documentation

## 2023-12-08 DIAGNOSIS — S6991XA Unspecified injury of right wrist, hand and finger(s), initial encounter: Secondary | ICD-10-CM | POA: Diagnosis present

## 2023-12-08 DIAGNOSIS — W293XXA Contact with powered garden and outdoor hand tools and machinery, initial encounter: Secondary | ICD-10-CM | POA: Diagnosis not present

## 2023-12-08 DIAGNOSIS — S61211A Laceration without foreign body of left index finger without damage to nail, initial encounter: Secondary | ICD-10-CM | POA: Diagnosis not present

## 2023-12-08 DIAGNOSIS — Z91040 Latex allergy status: Secondary | ICD-10-CM | POA: Diagnosis not present

## 2023-12-08 MED ORDER — BACITRACIN ZINC 500 UNIT/GM EX OINT
TOPICAL_OINTMENT | Freq: Once | CUTANEOUS | Status: AC
  Administered 2023-12-08: 1 via TOPICAL
  Filled 2023-12-08: qty 0.9

## 2023-12-08 MED ORDER — CEPHALEXIN 500 MG PO CAPS
500.0000 mg | ORAL_CAPSULE | Freq: Once | ORAL | Status: AC
  Administered 2023-12-08: 500 mg via ORAL
  Filled 2023-12-08: qty 1

## 2023-12-08 MED ORDER — TETANUS-DIPHTH-ACELL PERTUSSIS 5-2-15.5 LF-MCG/0.5 IM SUSP
0.5000 mL | Freq: Once | INTRAMUSCULAR | Status: AC
  Administered 2023-12-08: 0.5 mL via INTRAMUSCULAR
  Filled 2023-12-08: qty 0.5

## 2023-12-08 MED ORDER — MUPIROCIN CALCIUM 2 % EX CREA: 1.0000 | TOPICAL_CREAM | Freq: Two times a day (BID) | CUTANEOUS | 0 refills | Status: AC

## 2023-12-08 MED ORDER — ACETAMINOPHEN 325 MG PO TABS
650.0000 mg | ORAL_TABLET | Freq: Once | ORAL | Status: AC
  Administered 2023-12-08: 650 mg via ORAL
  Filled 2023-12-08: qty 2

## 2023-12-08 MED ORDER — CEPHALEXIN 500 MG PO CAPS: 500.0000 mg | ORAL_CAPSULE | Freq: Two times a day (BID) | ORAL | 0 refills | Status: AC

## 2023-12-08 NOTE — ED Provider Notes (Signed)
 [place] EMERGENCY DEPARTMENT AT [facility] Provider Note   CSN: [id number] Arrival date & time: 12/08/23  1551     Patient presents with: Laceration   Mercedes Riley is a 38 y.o. female presents today for a laceration to her right index finger in which she cut on a hedge tremor approximately 1 hour prior to arrival.  Patient denies numbness, tingling, any other injuries at this time.  Patient is unsure of when her last Tdap was.    Laceration      Prior to Admission medications   Medication Sig Start Date End Date Taking? Authorizing Provider  cephALEXin (KEFLEX) 500 MG capsule Take 1 capsule (500 mg total) by mouth 2 (two) times daily. 12/08/23  Yes Jonn Chaikin N, PA-C  mupirocin cream (BACTROBAN) 2 % Apply 1 Application topically 2 (two) times daily. 12/08/23  Yes Eshaan Titzer N, PA-C  BORIC ACID VAGINAL VA Place 1 suppository vaginally daily as needed (Odor).    [provider]  HYDROcodone -acetaminophen  (NORCO/VICODIN) 5-325 MG tablet Take 1 tablet by mouth every 6 (six) hours as needed for severe pain (pain score 7-10). 11/21/23   Meisinger, Krystal, MD    Allergies: Latex    Review of Systems  Skin:  Positive for wound.    Updated Vital Signs BP 105/80 (BP Location: Right Arm)   Pulse 65   Temp 98.3 F (36.8 C) (Oral)   Resp 16   LMP 12/08/2023   SpO2 95%   Physical Exam Vitals and nursing note reviewed.  Constitutional:      General: She is not in acute distress.    Appearance: She is well-developed.  HENT:     Head: Normocephalic and atraumatic.     Right Ear: External ear normal.     Left Ear: External ear normal.     Nose: Nose normal.     Mouth/Throat:     Mouth: Mucous membranes are moist.  Eyes:     Extraocular Movements: Extraocular movements intact.     Conjunctiva/sclera: Conjunctivae normal.  Cardiovascular:     Rate and Rhythm: Normal rate and regular rhythm.     Heart sounds: No murmur heard. Pulmonary:     Effort:  Pulmonary effort is normal. No respiratory distress.     Breath sounds: Normal breath sounds.  Abdominal:     Palpations: Abdomen is soft.     Tenderness: There is no abdominal tenderness.  Musculoskeletal:        General: Signs of injury present. No swelling or deformity.     Cervical back: Neck supple.  Skin:    General: Skin is warm and dry.     Capillary Refill: Capillary refill takes less than 2 seconds.     Findings: No bruising.     Comments: Approximately 1 cm x 1 cm abrasion noted to the palmar surface of the left second digit proximal phalanx with some active oozing on exam.  There is exposed subcutaneous fat.  Patient is neurovascularly intact with +2 radial pulses.  Patient has full ROM of her finger.  Neurological:     Mental Status: She is alert.  Psychiatric:        Mood and Affect: Mood normal.     (all labs ordered are listed, but only abnormal results are displayed) Labs Reviewed - No data to display  EKG: None  Radiology: DG Finger Index Left Result Date: 12/08/2023 CLINICAL DATA:  Laceration EXAM: LEFT INDEX FINGER 2+V COMPARISON:  None Available.  FINDINGS: Frontal, oblique, and lateral views of the left second digit are obtained. Soft tissue laceration is seen at the base of the second digit. No radiopaque foreign body or underlying fracture. Alignment is anatomic. IMPRESSION: 1. Soft tissue laceration.  No fracture or radiopaque foreign body. Electronically Signed   By: Ozell Daring M.D.   On: 12/08/2023 19:02     Procedures   Medications Ordered in the ED  acetaminophen  (TYLENOL ) tablet 650 mg (650 mg Oral Given 12/08/23 1837)  Tdap (ADACEL) injection 0.5 mL (0.5 mLs Intramuscular Given 12/08/23 1944)  cephALEXin (KEFLEX) capsule 500 mg (500 mg Oral Given 12/08/23 1943)  bacitracin ointment (1 Application Topical Given 12/08/23 1958)                                    Medical Decision Making Amount and/or Complexity of Data Reviewed Radiology:  ordered.  Risk OTC drugs. Prescription drug management.   This patient presents to the ED for concern of wound differential diagnosis includes laceration, abrasion, open fracture  Imaging Studies ordered:  I ordered imaging studies including left index finger x-ray I independently visualized and interpreted imaging which showed soft tissue laceration, no fracture or radiopaque foreign body I agree with the radiologist interpretation   Medicines ordered and prescription drug management:  I ordered medication including Tdap and Keflex    I have reviewed the patients home medicines and have made adjustments as needed   Problem List / ED Course:  Wound thoroughly irrigated, and wrapped with petroleum gauze and 4 x 4's.  Patient given outpatient course of Keflex.  Considered for admission or further workup however patient's vital signs, physical exam, and imaging are reassuring.  Patient given infection precautions.  Patient to follow-up with hand surgery for further evaluation workup.  I feel patient is safe for discharge at this time.      Final diagnoses:  Laceration of left index finger without foreign body without damage to nail, initial encounter    ED Discharge Orders          Ordered    cephALEXin (KEFLEX) 500 MG capsule  2 times daily        12/08/23 1954    mupirocin cream (BACTROBAN) 2 %  2 times daily        12/08/23 1959               Galen Russman N, PA-C 12/08/23 2336    Ula Prentice SAUNDERS, MD 12/08/23 [phone]

## 2023-12-08 NOTE — Discharge Instructions (Signed)
 Today you were seen for a laceration of your left index finger.  Please pick up your antibiotic and ache as prescribed.  Please follow-up with Dr. Teresa of hand surgery for further evaluation and workup.  You may alternate Tylenol /Motrin  for pain and swelling.  Please return to the ED or urgent care if you have signs of infection to include red streaking up your hand, puslike discharge, or fever that does not go down with Tylenol  or Motrin .  Thank you for letting us  treat you today. After reviewing your imaging, I feel you are safe to go home. Please follow up with your PCP in the next several days and provide them with your records from this visit. Return to the Emergency Room if pain becomes severe or symptoms worsen.

## 2023-12-08 NOTE — ED Triage Notes (Signed)
 Patient cut right index finger on hedger approx 1 hour ago. Bleeding controlled . Pain rated 2/10 Laceration is uneven and near base of finger.,

## 2023-12-09 ENCOUNTER — Encounter: Payer: Self-pay | Admitting: Radiology
